# Patient Record
Sex: Male | Born: 1950 | Race: White | Hispanic: No | Marital: Married | Smoking: Former smoker
Health system: Southern US, Community
[De-identification: ages and names within clinical notes are randomized; demographics above are authoritative.]

## PROBLEM LIST (undated history)

## (undated) DIAGNOSIS — N529 Male erectile dysfunction, unspecified: Secondary | ICD-10-CM

## (undated) DIAGNOSIS — E785 Hyperlipidemia, unspecified: Secondary | ICD-10-CM

## (undated) DIAGNOSIS — N281 Cyst of kidney, acquired: Secondary | ICD-10-CM

## (undated) DIAGNOSIS — R972 Elevated prostate specific antigen [PSA]: Secondary | ICD-10-CM

## (undated) DIAGNOSIS — M1990 Unspecified osteoarthritis, unspecified site: Secondary | ICD-10-CM

## (undated) DIAGNOSIS — Z973 Presence of spectacles and contact lenses: Secondary | ICD-10-CM

## (undated) DIAGNOSIS — N403 Nodular prostate with lower urinary tract symptoms: Secondary | ICD-10-CM

## (undated) DIAGNOSIS — N433 Hydrocele, unspecified: Secondary | ICD-10-CM

## (undated) HISTORY — PX: INGUINAL HERNIA REPAIR: SUR1180

## (undated) HISTORY — PX: BACK SURGERY: SHX140

---
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
## 2002-12-19 ENCOUNTER — Ambulatory Visit (HOSPITAL_COMMUNITY): Admission: RE | Admit: 2002-12-19 | Discharge: 2002-12-19 | Payer: Self-pay | Admitting: Family Medicine

## 2004-08-18 ENCOUNTER — Encounter: Admission: RE | Admit: 2004-08-18 | Discharge: 2004-08-18 | Payer: Self-pay | Admitting: General Surgery

## 2004-08-19 ENCOUNTER — Ambulatory Visit (HOSPITAL_BASED_OUTPATIENT_CLINIC_OR_DEPARTMENT_OTHER): Admission: RE | Admit: 2004-08-19 | Discharge: 2004-08-19 | Payer: Self-pay | Admitting: General Surgery

## 2004-08-19 ENCOUNTER — Ambulatory Visit (HOSPITAL_COMMUNITY): Admission: RE | Admit: 2004-08-19 | Discharge: 2004-08-19 | Payer: Self-pay | Admitting: General Surgery

## 2011-04-06 ENCOUNTER — Other Ambulatory Visit: Payer: Self-pay

## 2011-04-06 ENCOUNTER — Emergency Department (HOSPITAL_COMMUNITY)
Admission: EM | Admit: 2011-04-06 | Discharge: 2011-04-06 | Disposition: A | Discharge status: Home / Self Care | Payer: 59 | Attending: Emergency Medicine | Admitting: Emergency Medicine

## 2011-04-06 ENCOUNTER — Encounter (HOSPITAL_COMMUNITY): Payer: Self-pay | Admitting: *Deleted

## 2011-04-06 DIAGNOSIS — I10 Essential (primary) hypertension: Secondary | ICD-10-CM | POA: Insufficient documentation

## 2011-04-06 DIAGNOSIS — R42 Dizziness and giddiness: Secondary | ICD-10-CM | POA: Insufficient documentation

## 2011-04-06 DIAGNOSIS — Z87891 Personal history of nicotine dependence: Secondary | ICD-10-CM | POA: Insufficient documentation

## 2011-04-06 DIAGNOSIS — Z7982 Long term (current) use of aspirin: Secondary | ICD-10-CM | POA: Insufficient documentation

## 2011-04-06 DIAGNOSIS — H81399 Other peripheral vertigo, unspecified ear: Secondary | ICD-10-CM

## 2011-04-06 LAB — CBC
HCT: 39.1 % (ref 39.0–52.0)
Hemoglobin: 13.2 g/dL (ref 13.0–17.0)
MCH: 26.3 pg (ref 26.0–34.0)
MCHC: 33.8 g/dL (ref 30.0–36.0)
MCV: 77.9 fL — ABNORMAL LOW (ref 78.0–100.0)
Platelets: 182 10*3/uL (ref 150–400)
RBC: 5.02 MIL/uL (ref 4.22–5.81)
RDW: 13.6 % (ref 11.5–15.5)
WBC: 7.5 10*3/uL (ref 4.0–10.5)

## 2011-04-06 LAB — GLUCOSE, CAPILLARY: Glucose-Capillary: 79 mg/dL (ref 70–99)

## 2011-04-06 LAB — DIFFERENTIAL
Basophils Absolute: 0 10*3/uL (ref 0.0–0.1)
Basophils Relative: 0 % (ref 0–1)
Eosinophils Absolute: 0.1 10*3/uL (ref 0.0–0.7)
Eosinophils Relative: 2 % (ref 0–5)
Lymphocytes Relative: 21 % (ref 12–46)
Lymphs Abs: 1.6 10*3/uL (ref 0.7–4.0)
Monocytes Absolute: 0.7 10*3/uL (ref 0.1–1.0)
Monocytes Relative: 9 % (ref 3–12)
Neutro Abs: 5.1 10*3/uL (ref 1.7–7.7)
Neutrophils Relative %: 68 % (ref 43–77)

## 2011-04-06 LAB — URINALYSIS, ROUTINE W REFLEX MICROSCOPIC
Bilirubin Urine: NEGATIVE
Glucose, UA: NEGATIVE mg/dL
Hgb urine dipstick: NEGATIVE
Ketones, ur: NEGATIVE mg/dL
Nitrite: NEGATIVE
Protein, ur: NEGATIVE mg/dL
Specific Gravity, Urine: 1.016 (ref 1.005–1.030)
Urobilinogen, UA: 0.2 mg/dL (ref 0.0–1.0)
pH: 6 (ref 5.0–8.0)

## 2011-04-06 LAB — POCT I-STAT, CHEM 8
BUN: 16 mg/dL (ref 6–23)
Calcium, Ion: 1.22 mmol/L (ref 1.12–1.32)
Chloride: 107 mEq/L (ref 96–112)
Creatinine, Ser: 1.2 mg/dL (ref 0.50–1.35)
Glucose, Bld: 96 mg/dL (ref 70–99)
HCT: 41 % (ref 39.0–52.0)
Hemoglobin: 13.9 g/dL (ref 13.0–17.0)
Potassium: 4.5 mEq/L (ref 3.5–5.1)
Sodium: 143 mEq/L (ref 135–145)
TCO2: 24 mmol/L (ref 0–100)

## 2011-04-06 LAB — URINE MICROSCOPIC-ADD ON

## 2011-04-06 MED ORDER — MECLIZINE HCL 50 MG PO TABS: 25.0000 mg | ORAL_TABLET | Freq: Three times a day (TID) | ORAL | Status: AC | PRN

## 2011-04-06 MED ORDER — MECLIZINE HCL 25 MG PO TABS
25.0000 mg | ORAL_TABLET | Freq: Once | ORAL | Status: AC
  Administered 2011-04-06: 25 mg via ORAL
  Filled 2011-04-06: qty 1

## 2011-04-06 MED ORDER — SODIUM CHLORIDE 0.9 % IV BOLUS (SEPSIS)
1000.0000 mL | Freq: Once | INTRAVENOUS | Status: AC
  Administered 2011-04-06: 1000 mL via INTRAVENOUS

## 2011-04-06 NOTE — ED Notes (Signed)
(  Denies: questions, needs, sx or concerns unmet, denies questions), denies pain or dizziness at this time, steady gait, out with family x2, declined w/c, given Rx x1 and referral, VSS, "feels better, ready to go".

## 2011-04-06 NOTE — Discharge Instructions (Signed)

## 2011-04-06 NOTE — ED Notes (Signed)
States he was sitting at a desk and using a calculator. When raised head he was 'woozie' states when walking into building he was 'very dizzy'. No nausea, no vomiting. Last meal at noon today. Skin w/d, resp e/u. States when turning his head he doesn't feel normal.

## 2011-04-06 NOTE — ED Notes (Signed)
Pt ambulated around [facility]. Pt experienced several episodes of dizziness. When it occurs, pt drifts to the left.

## 2011-04-06 NOTE — ED Provider Notes (Signed)
History     CSN: [id number]  Arrival date & time 04/06/11  1521   First MD Initiated Contact with Patient 04/06/11 1629      Chief Complaint  Patient presents with  . Dizziness    (Consider location/radiation/quality/duration/timing/severity/associated sxs/prior treatment) HPI  61 year old male with history of hypertension presents with a chief complaint of dizziness. She states he was sitting at work today working on [employer], when he remembers standing up and felt very dizzy. He described dizziness as a "funny sensation. He proceeds to walk and felt very unsteady. He denies falling, or loss of consciousness. Due to the symptoms he requests to come to the ED for further evaluation. He denies sensation of room spinning around. Patient also denies headache, fever, double vision, nausea, vomiting, diarrhea, chest pain, shortness of breath, abdominal pain, dysuria, weakness, numbness. Since sickness. He denies any indication changes. Has been taken off this medication. Patient denies any prior history of stroke. This is states he feels fine when laying still, his symptoms only worsened with head movement.  Past Medical History  Diagnosis Date  . Hypertension     History reviewed. No pertinent past surgical history.  History reviewed. No pertinent family history.  History  Substance Use Topics  . Smoking status: Former Games developer  . Smokeless tobacco: Not on file  . Alcohol Use: No      Review of Systems  All other systems reviewed and are negative.    Allergies  Review of patient's allergies indicates no known allergies.  Home Medications   Current Outpatient Rx  Name Route Sig Dispense Refill  . ACETAMINOPHEN 325 MG PO TABS Oral Take 650 mg by mouth every 6 (six) hours as needed. For pain    . ASPIRIN EC 81 MG PO TBEC Oral Take 81 mg by mouth daily.    . CYCLOBENZAPRINE HCL 10 MG PO TABS Oral Take 10 mg by mouth 3 (three) times daily.    . IBUPROFEN 200 MG PO TABS  Oral Take 400 mg by mouth every 6 (six) hours as needed. For pain    . LISINOPRIL 20 MG PO TABS Oral Take 20 mg by mouth daily.    Marland Kitchen METOPROLOL SUCCINATE ER 50 MG PO TB24 Oral Take 50 mg by mouth daily. Take with or immediately following a meal.    . ADULT MULTIVITAMIN W/MINERALS CH Oral Take 1 tablet by mouth daily.    Marland Kitchen SIMVASTATIN 40 MG PO TABS Oral Take 40 mg by mouth every evening.      BP 160/96  Pulse 80  Temp(Src) 98.8 F (37.1 C) (Oral)  Resp 13  SpO2 96%  Physical Exam  Nursing note and vitals reviewed. Constitutional: He is oriented to person, place, and time. He appears well-developed and well-nourished. No distress.       Awake, alert, nontoxic appearance  HENT:  Head: Atraumatic.  Right Ear: External ear normal.  Left Ear: External ear normal.  Mouth/Throat: Oropharynx is clear and moist.  Eyes: Conjunctivae are normal. Pupils are equal, round, and reactive to light. Right eye exhibits no discharge. Left eye exhibits no discharge. Right eye exhibits nystagmus. Right eye exhibits normal extraocular motion. Left eye exhibits nystagmus.       Fatigable nystagmus bilaterally.  Neck: Normal range of motion. Neck supple. No Brudzinski's sign and no Kernig's sign noted.  Cardiovascular: Normal rate and regular rhythm.   Pulmonary/Chest: Effort normal. No respiratory distress. He exhibits no tenderness.  Abdominal: Soft. There is no  tenderness. There is no rebound.  Musculoskeletal: He exhibits no tenderness.       ROM appears intact, no obvious focal weakness  Neurological: He is alert and oriented to person, place, and time. He has normal strength and normal reflexes. No cranial nerve deficit or sensory deficit. He exhibits normal muscle tone. He displays a negative Romberg sign. Coordination and gait normal. GCS eye subscore is 4. GCS verbal subscore is 5. GCS motor subscore is 6.  Reflex Scores:      Patellar reflexes are 2+ on the right side and 2+ on the left side. Skin:  Skin is warm and dry. No rash noted.  Psychiatric: He has a normal mood and affect.    ED Course  Procedures (including critical care time)   Labs Reviewed  GLUCOSE, CAPILLARY   No results found.   No diagnosis found.  Results for orders placed during the hospital encounter of 04/06/11  GLUCOSE, CAPILLARY      Component Value Range   Glucose-Capillary 79  70 - 99 (mg/dL)  URINALYSIS, ROUTINE W REFLEX MICROSCOPIC      Component Value Range   Color, Urine YELLOW  YELLOW    APPearance CLEAR  CLEAR    Specific Gravity, Urine 1.016  1.005 - 1.030    pH 6.0  5.0 - 8.0    Glucose, UA NEGATIVE  NEGATIVE (mg/dL)   Hgb urine dipstick NEGATIVE  NEGATIVE    Bilirubin Urine NEGATIVE  NEGATIVE    Ketones, ur NEGATIVE  NEGATIVE (mg/dL)   Protein, ur NEGATIVE  NEGATIVE (mg/dL)   Urobilinogen, UA 0.2  0.0 - 1.0 (mg/dL)   Nitrite NEGATIVE  NEGATIVE    Leukocytes, UA SMALL (*) NEGATIVE   CBC      Component Value Range   WBC 7.5  4.0 - 10.5 (K/uL)   RBC 5.02  4.22 - 5.81 (MIL/uL)   Hemoglobin 13.2  13.0 - 17.0 (g/dL)   HCT 16.1  09.6 - 04.5 (%)   MCV 77.9 (*) 78.0 - 100.0 (fL)   MCH 26.3  26.0 - 34.0 (pg)   MCHC 33.8  30.0 - 36.0 (g/dL)   RDW 40.9  81.1 - 91.4 (%)   Platelets 182  150 - 400 (K/uL)  DIFFERENTIAL      Component Value Range   Neutrophils Relative 68  43 - 77 (%)   Neutro Abs 5.1  1.7 - 7.7 (K/uL)   Lymphocytes Relative 21  12 - 46 (%)   Lymphs Abs 1.6  0.7 - 4.0 (K/uL)   Monocytes Relative 9  3 - 12 (%)   Monocytes Absolute 0.7  0.1 - 1.0 (K/uL)   Eosinophils Relative 2  0 - 5 (%)   Eosinophils Absolute 0.1  0.0 - 0.7 (K/uL)   Basophils Relative 0  0 - 1 (%)   Basophils Absolute 0.0  0.0 - 0.1 (K/uL)  URINE MICROSCOPIC-ADD ON      Component Value Range   Squamous Epithelial / LPF RARE  RARE    WBC, UA 3-6  <3 (WBC/hpf)   RBC / HPF 0-2  <3 (RBC/hpf)   Bacteria, UA RARE  RARE   POCT I-STAT, CHEM 8      Component Value Range   Sodium 143  135 - 145 (mEq/L)    Potassium 4.5  3.5 - 5.1 (mEq/L)   Chloride 107  96 - 112 (mEq/L)   BUN 16  6 - 23 (mg/dL)   Creatinine, Ser 7.82  0.50 -  1.35 (mg/dL)   Glucose, Bld 96  70 - 99 (mg/dL)   Calcium, Ion 1.19  1.47 - 1.32 (mmol/L)   TCO2 24  0 - 100 (mmol/L)   Hemoglobin 13.9  13.0 - 17.0 (g/dL)   HCT 82.9  56.2 - 13.0 (%)   No results found.    MDM  With symptoms suggestive of BPPV.  In no acute distress. He has no focal neuro deficit. He is Romberg negative, with normal gait. His dizziness is motion induce. Meclizine given for symptomatic treatment. Basic diagnostic labs order, with ecg, ua.  No head CT order as it has low yield for hemorrhagic stroke and pt denies headache.  I have low suspicion for central vertigo.    5:21 PM No significant finding on Dix-Hallpike maneuver, although pt sts he feels some dizziness sensation when head tilted to the right.  Discuss option of head CT scan with patient.  Discussed care with my attending.   6:11 PM Negative orthostatic VS.  was able to ambulate. No Significant dizziness.  Work up today is unremarkable.  Strict f/u instruction given.  Will discharge with meclizine.  Pt and family member agrees with plan.  My attending also agrees with plan.      Fayrene Helper, PA-C 04/06/11 1857

## 2011-04-06 NOTE — ED Notes (Signed)
PA at bedside.

## 2011-04-08 NOTE — ED Provider Notes (Signed)
60 y.o.malewho presents with dizziness  CV: RRR, no m/r/g, no pitting edema of the lower extremities Pulm:CTAB, no c/w/r GI: SNTND, + BS, no guarding or rebound Skin: no rashes noted MSK: moves all 4 extremities symmetrically, no deformities or injuries noted Neuro: CN 2-12 intact, no abnormalities of strength or sensation, A and O x 3  Discussed risks vs. Benefits of declining CT.  Patient presentation favors orthostasis and possible component of BPPV. ABCD2 < 3. Patient prefers no CT today since symptoms resolved.  Medical screening examination/treatment/procedure(s) were conducted as a shared visit with non-physician practitioner(s) and myself.  I personally evaluated the patient during the encounter        Cyndra Numbers, MD 04/08/11 [phone]

## 2015-02-05 MED FILL — POLYETHYLENE GLYCOL 3350 PO: 90 days supply | Qty: 1581 | Fill #3

## 2015-02-08 MED FILL — CIALIS 5 MG TABLET: 5 | 30 days supply | Qty: 30 | Fill #1

## 2015-02-14 MED FILL — DICLOFENAC SOD EC 50 MG TAB: 50 | 30 days supply | Qty: 60 | Fill #0

## 2015-02-22 DIAGNOSIS — M545 Low back pain: Secondary | ICD-10-CM | POA: Diagnosis not present

## 2015-02-22 DIAGNOSIS — K5909 Other constipation: Secondary | ICD-10-CM | POA: Diagnosis not present

## 2015-02-22 DIAGNOSIS — I10 Essential (primary) hypertension: Secondary | ICD-10-CM | POA: Diagnosis not present

## 2015-02-22 DIAGNOSIS — G8929 Other chronic pain: Secondary | ICD-10-CM | POA: Diagnosis not present

## 2015-02-22 DIAGNOSIS — Z1211 Encounter for screening for malignant neoplasm of colon: Secondary | ICD-10-CM | POA: Diagnosis not present

## 2015-02-22 DIAGNOSIS — E784 Other hyperlipidemia: Secondary | ICD-10-CM | POA: Diagnosis not present

## 2015-02-22 DIAGNOSIS — N529 Male erectile dysfunction, unspecified: Secondary | ICD-10-CM | POA: Diagnosis not present

## 2015-03-21 MED FILL — METOPROLOL SUCC ER 50 MG TA: 50 | 90 days supply | Qty: 90 | Fill #0

## 2015-03-21 MED FILL — CIALIS 5 MG TABLET: 5 | 30 days supply | Qty: 30 | Fill #2

## 2015-03-21 MED FILL — LISINOPRIL-HCTZ 20-12.5 MG: 20-12.5 | 90 days supply | Qty: 90 | Fill #0

## 2015-03-21 MED FILL — DICLOFENAC SOD EC 50 MG TAB: 50 | 30 days supply | Qty: 60 | Fill #1

## 2015-03-22 MED FILL — ROSUVASTATIN CALCIUM 20 MG: 20 | 90 days supply | Qty: 90 | Fill #0

## 2015-03-26 MED FILL — tiZANidine HCL 4 MG TABS: 4 | 30 days supply | Qty: 120 | Fill #0

## 2015-04-19 MED FILL — TAMSULOSIN HCL 0.4 MG CAP: 0.4 | 90 days supply | Qty: 180 | Fill #0

## 2015-04-19 MED FILL — DICLOFENAC SOD EC 50 MG TAB: 50 | 30 days supply | Qty: 60 | Fill #2

## 2015-04-22 MED FILL — CIALIS 5 MG TABLET: 5 | 30 days supply | Qty: 30 | Fill #3

## 2015-05-03 DIAGNOSIS — R635 Abnormal weight gain: Secondary | ICD-10-CM | POA: Diagnosis not present

## 2015-05-03 DIAGNOSIS — E291 Testicular hypofunction: Secondary | ICD-10-CM | POA: Diagnosis not present

## 2015-05-08 DIAGNOSIS — R7301 Impaired fasting glucose: Secondary | ICD-10-CM | POA: Diagnosis not present

## 2015-05-08 DIAGNOSIS — E663 Overweight: Secondary | ICD-10-CM | POA: Diagnosis not present

## 2015-05-20 DIAGNOSIS — E663 Overweight: Secondary | ICD-10-CM | POA: Diagnosis not present

## 2015-05-20 DIAGNOSIS — R7301 Impaired fasting glucose: Secondary | ICD-10-CM | POA: Diagnosis not present

## 2015-05-20 DIAGNOSIS — E539 Vitamin B deficiency, unspecified: Secondary | ICD-10-CM | POA: Diagnosis not present

## 2015-05-21 MED FILL — DICLOFENAC SOD EC 50 MG TAB: 50 | 30 days supply | Qty: 60 | Fill #3

## 2015-05-24 MED FILL — CIALIS 5 MG TABLET: 5 | 30 days supply | Qty: 30 | Fill #4

## 2015-05-30 DIAGNOSIS — E539 Vitamin B deficiency, unspecified: Secondary | ICD-10-CM | POA: Diagnosis not present

## 2015-05-30 DIAGNOSIS — R7301 Impaired fasting glucose: Secondary | ICD-10-CM | POA: Diagnosis not present

## 2015-05-30 DIAGNOSIS — E663 Overweight: Secondary | ICD-10-CM | POA: Diagnosis not present

## 2015-06-04 DIAGNOSIS — E663 Overweight: Secondary | ICD-10-CM | POA: Diagnosis not present

## 2015-06-04 DIAGNOSIS — R7301 Impaired fasting glucose: Secondary | ICD-10-CM | POA: Diagnosis not present

## 2015-06-04 DIAGNOSIS — E539 Vitamin B deficiency, unspecified: Secondary | ICD-10-CM | POA: Diagnosis not present

## 2015-06-05 MED FILL — tiZANidine HCL 4 MG TABS: 4 | 30 days supply | Qty: 120 | Fill #1

## 2015-06-11 DIAGNOSIS — E539 Vitamin B deficiency, unspecified: Secondary | ICD-10-CM | POA: Diagnosis not present

## 2015-06-11 DIAGNOSIS — E663 Overweight: Secondary | ICD-10-CM | POA: Diagnosis not present

## 2015-06-11 DIAGNOSIS — R7301 Impaired fasting glucose: Secondary | ICD-10-CM | POA: Diagnosis not present

## 2015-06-25 MED FILL — LISINOPRIL-HCTZ 20-12.5 MG: 20-12.5 | 90 days supply | Qty: 90 | Fill #1

## 2015-06-25 MED FILL — METOPROLOL SUCC ER 50 MG TA: 50 | 90 days supply | Qty: 90 | Fill #1

## 2015-06-25 MED FILL — CIALIS 5 MG TABLET: 5 | 30 days supply | Qty: 30 | Fill #5

## 2015-06-25 MED FILL — DICLOFENAC SOD EC 50 MG TAB: 50 | 30 days supply | Qty: 60 | Fill #4

## 2015-06-25 MED FILL — ROSUVASTATIN CALCIUM 20 MG: 20 | 90 days supply | Qty: 90 | Fill #1

## 2015-07-09 MED FILL — traMADol HCL 50 MG TABS: 50 | 30 days supply | Qty: 60 | Fill #0

## 2015-07-26 MED FILL — CIALIS 5 MG TABLET: 5 | 30 days supply | Qty: 30 | Fill #6

## 2015-07-26 MED FILL — DICLOFENAC SOD EC 50 MG TAB: 50 | 30 days supply | Qty: 60 | Fill #0

## 2015-07-26 MED FILL — TAMSULOSIN HCL 0.4 MG CAP: 0.4 | 90 days supply | Qty: 180 | Fill #1

## 2015-08-26 MED FILL — CIALIS 5 MG TABLET: 5 | 30 days supply | Qty: 30 | Fill #7

## 2015-09-11 ENCOUNTER — Encounter: Payer: Self-pay | Admitting: Family Medicine

## 2015-09-11 ENCOUNTER — Ambulatory Visit (INDEPENDENT_AMBULATORY_CARE_PROVIDER_SITE_OTHER): Payer: 59 | Admitting: Family Medicine

## 2015-09-11 DIAGNOSIS — R319 Hematuria, unspecified: Secondary | ICD-10-CM

## 2015-09-11 DIAGNOSIS — N400 Enlarged prostate without lower urinary tract symptoms: Secondary | ICD-10-CM | POA: Diagnosis not present

## 2015-09-11 DIAGNOSIS — M159 Polyosteoarthritis, unspecified: Secondary | ICD-10-CM | POA: Insufficient documentation

## 2015-09-11 DIAGNOSIS — N529 Male erectile dysfunction, unspecified: Secondary | ICD-10-CM | POA: Insufficient documentation

## 2015-09-11 DIAGNOSIS — E7800 Pure hypercholesterolemia, unspecified: Secondary | ICD-10-CM

## 2015-09-11 DIAGNOSIS — M21962 Unspecified acquired deformity of left lower leg: Secondary | ICD-10-CM

## 2015-09-11 DIAGNOSIS — M21961 Unspecified acquired deformity of right lower leg: Secondary | ICD-10-CM | POA: Diagnosis not present

## 2015-09-11 LAB — POCT URINALYSIS DIPSTICK
Bilirubin, UA: NEGATIVE
Blood, UA: NEGATIVE
Glucose, UA: NEGATIVE
Ketones, UA: NEGATIVE
Nitrite, UA: NEGATIVE
Protein, UA: NEGATIVE
Spec Grav, UA: 1.01
Urobilinogen, UA: 0.2
pH, UA: 5.5

## 2015-09-11 LAB — POCT UA - MICROSCOPIC ONLY

## 2015-09-11 MED ORDER — TIZANIDINE HCL 4 MG PO CAPS: 4.0000 mg | ORAL_CAPSULE | Freq: Three times a day (TID) | ORAL | 1 refills | Status: DC | PRN

## 2015-09-11 MED FILL — tiZANidine HCL 4 MG TABS: 4 | 20 days supply | Qty: 60 | Fill #0

## 2015-09-11 NOTE — Assessment & Plan Note (Signed)
Sounds consistent with a renal stone or transient urine discoloration .  Check ua for hematuria

## 2015-09-11 NOTE — Patient Instructions (Addendum)
Nice to meet you  I will send for records from your prior doctor  We will check a urinalysis and I will contact you if we need any further tests  I sent in a prescription for Taznidine  Try to monitor your blood pressure - it should be less than 140/90 most of the time   I would consider a colonoscopy  Please come back in 2 months when we can go over all your tests

## 2015-09-11 NOTE — Progress Notes (Signed)
Subjective  Patient is presenting for his first visit with the following illnesses  Joint pain Primarily back, knees and shoulders.  Wears sleeves on knees and back   Has 1-2 hours of stiffness in am and pain at end of day.  Moving makes it worse.  No joint redness or significant swelling.  No rashes.  Has tried various medications on his medication list but not much tylenol or NSAIDS.  Has seen a pain specialist in [place]   Foot Pain deformity Slowly worsening over the years.  Feet are crooked and hurt after being on them.  No skin breakdown or prior surgery   Red Urine About a month ago episode of red urine with some back pain.  No fever or trauma. Strong fhx of renal stones but has never had himself   Chief Complaint noted Review of Symptoms - see HPI PMH - Smoking status noted.     Objective Vital Signs reviewed Alert nad Ears:  External ear exam shows no significant lesions or deformities.  Otoscopic examination reveals clear canals, tympanic membranes are intact bilaterally without bulging, retraction, inflammation or discharge. Hearing is grossly normal bilaterall Eye - Pupils Equal Round Reactive to light, Extraocular movements intact, Fundi without hemorrhage or visible lesions, Conjunctiva without redness or discharge Neck:  No deformities, thyromegaly, masses, or tenderness noted.   Supple with full range of motion without pain. Heart - Regular rate and rhythm.  No murmurs, gallops or rubs.    Lungs:  Normal respiratory effort, chest expands symmetrically. Lungs are clear to auscultation, no crackles or wheezes. Abdomen: soft and non-tender without masses, organomegaly or hernias noted.  No guarding or rebound Extremities:  No cyanosis, edema, or deformity noted with good range of motion of all major joints.   Feet - Large toe deviation bilaterally with hammer toes and calluses.  No skin breakdown  Psych:  Cognition and judgment appear intact. Alert, communicative  and  cooperative with normal attention span and concentration. No apparent delusions, illusions, hallucinations Skin:  Intact without suspicious lesions or rashes     Assessments/Plans  No problem-specific Assessment & Plan notes found for this encounter.   See Encounter view if individual problem A/Ps not visible See after visit summary for details of patient instuctions

## 2015-09-11 NOTE — Assessment & Plan Note (Signed)
Causes him significant pain.  Taking reasonable medications.  No signs of inflamatory arthritis.  Will send for records

## 2015-09-11 NOTE — Assessment & Plan Note (Signed)
Moderately severe and likely to worsen.  Will refer to podiatry

## 2015-09-12 ENCOUNTER — Telehealth: Payer: Self-pay

## 2015-09-12 MED ORDER — DICLOFENAC SODIUM 50 MG PO TBEC: 50.0000 mg | DELAYED_RELEASE_TABLET | Freq: Two times a day (BID) | ORAL | 1 refills | Status: DC | PRN

## 2015-09-12 MED FILL — DICLOFENAC SOD EC 50 MG TAB: 50 | 90 days supply | Qty: 180 | Fill #0

## 2015-09-12 NOTE — Telephone Encounter (Signed)
Pt stopped by to state he left out a medication yesterday during his visit. Dicofenac DD EC 50mg  BID..... Pt would also like a 90day supply sent to his pharmacy. Please at advise.

## 2015-09-25 ENCOUNTER — Other Ambulatory Visit: Payer: Self-pay | Admitting: *Deleted

## 2015-09-25 MED ORDER — LISINOPRIL-HYDROCHLOROTHIAZIDE 20-12.5 MG PO TABS: 1.0000 | ORAL_TABLET | Freq: Every day | ORAL | 3 refills | Status: DC

## 2015-09-25 MED ORDER — TADALAFIL 5 MG PO TABS: 5.0000 mg | ORAL_TABLET | Freq: Every day | ORAL | 3 refills | Status: DC | PRN

## 2015-09-25 MED FILL — CIALIS 5 MG TABLET: 5 | 30 days supply | Qty: 30 | Fill #8

## 2015-09-25 MED FILL — LISINOPRIL-HCTZ 20-12.5 MG: 20-12.5 | 90 days supply | Qty: 90 | Fill #0

## 2015-09-25 NOTE — Telephone Encounter (Signed)
Patient called and needs refills on his medication. Durwin Davisson,CMA

## 2015-09-27 ENCOUNTER — Ambulatory Visit (INDEPENDENT_AMBULATORY_CARE_PROVIDER_SITE_OTHER): Payer: 59 | Admitting: Podiatry

## 2015-09-27 ENCOUNTER — Encounter: Payer: Self-pay | Admitting: Podiatry

## 2015-09-27 ENCOUNTER — Ambulatory Visit (INDEPENDENT_AMBULATORY_CARE_PROVIDER_SITE_OTHER): Payer: 59

## 2015-09-27 DIAGNOSIS — R52 Pain, unspecified: Secondary | ICD-10-CM

## 2015-09-27 DIAGNOSIS — M2040 Other hammer toe(s) (acquired), unspecified foot: Secondary | ICD-10-CM | POA: Diagnosis not present

## 2015-09-27 DIAGNOSIS — M2010 Hallux valgus (acquired), unspecified foot: Secondary | ICD-10-CM | POA: Diagnosis not present

## 2015-09-27 DIAGNOSIS — L84 Corns and callosities: Secondary | ICD-10-CM

## 2015-09-27 NOTE — Progress Notes (Signed)
   Subjective:    Patient ID: Nicholas Houston, male    DOB: 1950-08-07, 65 y.o.   MRN: [id number]  HPI  [age] year old male presents the office today for concerns of bunions and hammertoes and calluses to both the CT which is been ongoing for several months and the callouses gotten worse his been difficult to stand and walk due to the pain. Denies any drainage or redness or any swelling to his feet. He said no recent treatment for this. No other complaints.  Review of Systems  All other systems reviewed and are negative.      Objective:   Physical Exam General: AAO x3, NAD  Dermatological: Hyperkeratotic lesions bilateral submetatarsal 2 and third interspaces bilaterally. No underlying ulceration, drainage or other signs of infection.  Vascular: Dorsalis Pedis artery and Posterior Tibial artery pedal pulses are 2/4 bilateral with immedate capillary fill time. Pedal hair growth present.There is no pain with calf compression, swelling, warmth, erythema.   Neruologic: Grossly intact via light touch bilateral. Vibratory intact via tuning fork bilateral. Protective threshold with Semmes Wienstein monofilament intact to all pedal sites bilateral.   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 Tenderness all hyperkeratotic lesions. There is a decrease in medial arch upon weightbearing and some mild subjective tenderness on medial band of plantar fascial in the arch of the foot however he is next into the pain today. He states he gets pain in this area when working and standing a lot in the day. No other areas of tenderness bilaterally. MMT 5/5.  Assessment: Symptomatic hyperkeratotic lesions with bunion, hammertoe, flatfoot deformity  Plan: -Treatment options discussed including all alternatives, risks, and complications -Etiology of symptoms were discussed -X-rays were obtained and reviewed with the patient. No evidence of acute fracture or stress  fracture identified today bunion hammertoes are present. -Hyperkeratotic lesions debrided today 4 without complications or bleeding -Dispensed offloading pads -Discussed shoe gear modifications as well as orthotics. He'll start with power steps. These were dispensed today. -Follow up if symptoms continue or worsen. Call any questions concerns the meantime.   Ovid CurdMatthew Indiya Izquierdo, DPM

## 2015-09-27 NOTE — Patient Instructions (Signed)
Hammer Toes Hammer toes is a condition in which one or more of your toes is permanently flexed. CAUSES  This happens when a muscle imbalance or abnormal bone length makes your small toes buckle. This causes the toe joint to contract and the strong cord-like bands that attach muscles to the bones (tendons) in your toes to shorten.  SIGNS AND SYMPTOMS  Common symptoms of flexible hammer toes include:   A buildup of skin cells (corns). Corns occur where boney bumps come in frequent contact with hard surfaces. For example, where your shoes press and rub.  Irritation.  Inflammation.  Pain.  Limited motion in your toes. DIAGNOSIS  Hammer toes are diagnosed through a physical exam of your toes. During the exam, your health care provider may try to reproduce your symptoms by manipulating your foot. Often, X-ray exams are done to determine the degree of deformity and to make sure that the cause is not a fracture.  TREATMENT  Hammer toes can be treated with corrective surgery. There are several types of surgical procedures that can treat hammer toes. The most common procedures include:  Arthroplasty--A portion of the joint is surgically removed and your toe is straightened. The gap fills in with fibrous tissue. This procedure helps treat pain and deformity and helps restore function.  Fusion--Cartilage between the two bones of the affected joint is taken out and the bones fuse together into one longer bone. This helps keep your toe stable and reduces pain but leaves your toe stiff, yet straight.  Implantation--A portion of your bone is removed and replaced with an implant to restore motion.  Flexor tendon transfers--This procedure repositions the tendons that curl the toes down (flexor tendons). This may be done to release the deforming force that causes your toe to buckle. Several of these procedures require fixing your toe with a pin that is visible at the tip of your toe. The pin keeps the toe  straight during healing. Your health care provider will remove the pin usually within 4-8 weeks after the procedure.    This information is not intended to replace advice given to you by your health care provider. Make sure you discuss any questions you have with your health care provider.   Document Released: 01/10/2000 Document Revised: 01/17/2013 Document Reviewed: 09/19/2012 Elsevier Interactive Patient Education 2016 [employer]   Bunion (Hallux Valgus) A bony bump (protrusion) on the inside of the foot, at the base of the first toe, is called a bunion (hallux valgus). A bunion causes the first toe to angle toward the other toes. SYMPTOMS   A bony bump on the inside of the foot, causing an outward turning of the first toe. It may also overlap the second toe.  Thickening of the skin (callus) over the bony bump.  Fluid buildup under the callus. Fluid may become red, tender, and swollen (inflamed) with constant irritation or pressure.  Foot pain and stiffness. CAUSES  Many causes exist, including:  Inherited from your family (genetics).  Injury (trauma) forcing the first toe into a position in which it overlaps other toes.  Bunions are also associated with wearing shoes that have a narrow toe box (pointy shoes). RISK INCREASES WITH:  Family history of foot abnormalities, especially bunions.  Arthritis.  Narrow shoes, especially high heels. PREVENTION  Wear shoes with a wide toe box.  Avoid shoes with high heels.  Wear a small pad between the big toe and second toe.  Maintain proper conditioning:  Foot and ankle  flexibility.  Muscle strength and endurance. PROGNOSIS  With proper treatment, bunions can typically be cured. Occasionally, surgery is required.  RELATED COMPLICATIONS   Infection of the bunion.  Arthritis of the first toe.  Risks of surgery, including infection, bleeding, injury to nerves (numb toe), recurrent bunion, overcorrection (toe points  inward), arthritis of the big toe, big toe pointing upward, and bone not healing. TREATMENT  Treatment first consists of stopping the activities that aggravate the pain, taking pain medicines, and icing to reduce inflammation and pain. Wear shoes with a wide toe box. Shoes can be modified by a shoe repair person to relieve pressure on the bunion, especially if you cannot find shoes with a wide enough toe box. You may also place a pad with the center cut out in your shoe, to reduce pressure on the bunion. Sometimes, an arch support (orthotic) may reduce pressure on the bunion and alleviate the symptoms. Stretching and strengthening exercises for the muscles of the foot may be useful. You may choose to wear a brace or pad at night to hold the big toe away from the second toe. If non-surgical treatments are not successful, surgery may be needed. Surgery involves removing the overgrown tissue and correcting the position of the first toe, by realigning the bones. Bunion surgery is typically performed on an outpatient basis, meaning you can go home the same day as surgery. The surgery may involve cutting the mid portion of the bone of the first toe, or just cutting and repairing (reconstructing) the ligaments and soft tissues around the first toe.  MEDICATION   If pain medicine is needed, nonsteroidal anti-inflammatory medicines, such as aspirin and ibuprofen, or other minor pain relievers, such as acetaminophen, are often recommended.  Do not take pain medicine for 7 days before surgery.  Prescription pain relievers are usually only prescribed after surgery. Use only as directed and only as much as you need.  Ointments applied to the skin may be helpful. HEAT AND COLD  Cold treatment (icing) relieves pain and reduces inflammation. Cold treatment should be applied for 10 to 15 minutes every 2 to 3 hours for inflammation and pain and immediately after any activity that aggravates your symptoms. Use ice packs  or an ice massage.  Heat treatment may be used prior to performing the stretching and strengthening activities prescribed by your caregiver, physical therapist, or athletic trainer. Use a heat pack or a warm soak. SEEK MEDICAL CARE IF:   Symptoms get worse or do not improve in 2 weeks, despite treatment.  After surgery, you develop fever, increasing pain, redness, swelling, drainage of fluids, bleeding, or increasing warmth around the surgical area.  New, unexplained symptoms develop. (Drugs used in treatment may produce side effects.)   This information is not intended to replace advice given to you by your health care provider. Make sure you discuss any questions you have with your health care provider.   Document Released: 01/12/2005 Document Revised: 04/06/2011 Document Reviewed: 04/26/2008 Elsevier Interactive Patient Education [employer]

## 2015-10-02 ENCOUNTER — Ambulatory Visit: Payer: Self-pay | Admitting: Podiatry

## 2015-10-28 ENCOUNTER — Other Ambulatory Visit: Payer: Self-pay | Admitting: Family Medicine

## 2015-10-28 MED FILL — CIALIS 5 MG TABLET: 5 | 30 days supply | Qty: 30 | Fill #9

## 2015-10-28 NOTE — Telephone Encounter (Signed)
Pt needs a refill on tamsulosin. Please advise. Thanks! ep

## 2015-10-30 MED ORDER — TAMSULOSIN HCL 0.4 MG PO CAPS: 0.4000 mg | ORAL_CAPSULE | Freq: Two times a day (BID) | ORAL | 6 refills | Status: DC

## 2015-10-30 MED FILL — TAMSULOSIN HCL 0.4 MG CAP: 0.4 | 30 days supply | Qty: 60 | Fill #0

## 2015-11-27 MED FILL — CIALIS 5 MG TABLET: 5 | 30 days supply | Qty: 30 | Fill #10

## 2015-11-28 MED FILL — TAMSULOSIN HCL 0.4 MG CAP: 0.4 | 30 days supply | Qty: 60 | Fill #1

## 2015-12-04 ENCOUNTER — Encounter: Payer: Self-pay | Admitting: Family Medicine

## 2015-12-04 ENCOUNTER — Ambulatory Visit (INDEPENDENT_AMBULATORY_CARE_PROVIDER_SITE_OTHER): Payer: 59 | Admitting: Family Medicine

## 2015-12-04 DIAGNOSIS — Z23 Encounter for immunization: Secondary | ICD-10-CM

## 2015-12-04 DIAGNOSIS — R5383 Other fatigue: Secondary | ICD-10-CM

## 2015-12-04 DIAGNOSIS — Z1159 Encounter for screening for other viral diseases: Secondary | ICD-10-CM | POA: Diagnosis not present

## 2015-12-04 DIAGNOSIS — F329 Major depressive disorder, single episode, unspecified: Secondary | ICD-10-CM | POA: Insufficient documentation

## 2015-12-04 DIAGNOSIS — F32A Depression, unspecified: Secondary | ICD-10-CM | POA: Insufficient documentation

## 2015-12-04 LAB — COMPREHENSIVE METABOLIC PANEL
ALBUMIN: 4.2 g/dL (ref 3.6–5.1)
ALK PHOS: 35 U/L — AB (ref 40–115)
ALT: 22 U/L (ref 9–46)
AST: 24 U/L (ref 10–35)
BILIRUBIN TOTAL: 0.4 mg/dL (ref 0.2–1.2)
BUN: 16 mg/dL (ref 7–25)
CO2: 22 mmol/L (ref 20–31)
CREATININE: 1.39 mg/dL — AB (ref 0.70–1.25)
Calcium: 9.2 mg/dL (ref 8.6–10.3)
Chloride: 101 mmol/L (ref 98–110)
Glucose, Bld: 87 mg/dL (ref 65–99)
Potassium: 4.2 mmol/L (ref 3.5–5.3)
SODIUM: 136 mmol/L (ref 135–146)
TOTAL PROTEIN: 6.5 g/dL (ref 6.1–8.1)

## 2015-12-04 LAB — CBC
HEMATOCRIT: 40.5 % (ref 38.5–50.0)
HEMOGLOBIN: 13.5 g/dL (ref 13.2–17.1)
MCH: 26.7 pg — ABNORMAL LOW (ref 27.0–33.0)
MCHC: 33.3 g/dL (ref 32.0–36.0)
MCV: 80 fL (ref 80.0–100.0)
MPV: 10.1 fL (ref 7.5–12.5)
Platelets: 214 10*3/uL (ref 140–400)
RBC: 5.06 MIL/uL (ref 4.20–5.80)
RDW: 14.2 % (ref 11.0–15.0)
WBC: 4.4 10*3/uL (ref 3.8–10.8)

## 2015-12-04 LAB — TSH: TSH: 3.02 mIU/L (ref 0.40–4.50)

## 2015-12-04 MED ORDER — ZOSTER VACCINE LIVE 19400 UNT/0.65ML ~~LOC~~ SUSR: 0.6500 mL | Freq: Once | SUBCUTANEOUS | 0 refills | Status: AC

## 2015-12-04 NOTE — Assessment & Plan Note (Signed)
New presentation.  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  Will check labs to rule out other systemic diseases.  Discussed counseling and perhaps medication in future.  Note family history of what sounds to be bipolar do in siblings

## 2015-12-04 NOTE — Progress Notes (Signed)
Subjective  Patient is presenting with the following illnesses  Presenting Issue: feeling not as well as he should.   Report of symptoms: Feels he is not enjoying life as much as he should.  Used to like Engineer, waterGolf and Nascar and football games but not as much now.  Some excessive sleeping.    Duration of CURRENT symptoms:several years Age of onset of first mood disturbance:this is first time  Impact on function: Does not want to do things as much.  Does not go out as much.   Things with his wife and daughter are as good as they have ever been  Psychiatric History -No past history of psychiatric issues  Family history of psychiatric issues:Brother and sister with substance abuse and what sounds like bipolar - up and down.  Brother was committed in the distant past  Current and history of substance ZOX:WRUEuse:none  No history of weight loss that is unintentional.  No fever or heat cold intolerance.  Is eating better and exercising more than ever has   PHQ-9:12  Chief Complaint noted Review of Symptoms - see HPI PMH - Smoking status noted.     Objective Vital Signs reviewed Psych:  Cognition and judgment appear intact. Alert, communicative  and cooperative with normal attention span and concentration. No apparent delusions, illusions, hallucinations  Assessments/Plans  No problem-specific Assessment & Plan notes found for this encounter.   See Encounter view if individual problem A/Ps not visible See after visit summary for details of patient instuctions

## 2015-12-04 NOTE — Patient Instructions (Signed)
Good to see you today!  Thanks for coming in.  I will call you if your lab tests are not normal.  Otherwise we will discuss them at your next visit.  Come back in 2-3 weeks  Note what things are enjoyable and what things are not  Call if your symptoms are gettting worse

## 2015-12-04 NOTE — Progress Notes (Signed)
Subjective  Patient is presenting with the following illnesses     Chief Complaint noted Review of Symptoms - see HPI PMH - Smoking status noted.     Objective Vital Signs reviewed     Assessments/Plans  No problem-specific Assessment & Plan notes found for this encounter.   See Encounter view if individual problem A/Ps not visible See after visit summary for details of patient instuctions 

## 2015-12-05 LAB — HEPATITIS C ANTIBODY: HCV Ab: NEGATIVE

## 2015-12-12 MED FILL — DICLOFENAC SOD EC 50 MG TAB: 50 | 90 days supply | Qty: 180 | Fill #1

## 2015-12-27 MED FILL — TAMSULOSIN HCL 0.4 MG CAP: 0.4 | 30 days supply | Qty: 60 | Fill #2

## 2015-12-27 MED FILL — CIALIS 5 MG TABLET: 5 | 30 days supply | Qty: 30 | Fill #11

## 2016-01-01 ENCOUNTER — Encounter: Payer: Self-pay | Admitting: Family Medicine

## 2016-01-01 ENCOUNTER — Ambulatory Visit (INDEPENDENT_AMBULATORY_CARE_PROVIDER_SITE_OTHER): Payer: 59 | Admitting: Family Medicine

## 2016-01-01 DIAGNOSIS — F321 Major depressive disorder, single episode, moderate: Secondary | ICD-10-CM | POA: Diagnosis not present

## 2016-01-01 NOTE — Patient Instructions (Signed)
Good to see you today!  Thanks for coming in.  Employee Assistance [phone] Call to set up a time to meet  If you have any problems getting an appointment please call me  If you are feeling worse or have new symptoms call or contact me through My Chart  Come back in February to see how things are going

## 2016-01-01 NOTE — Assessment & Plan Note (Signed)
Seems consistent with this dx after exam and lab work up.  Discussed possible options.  He is interested in counseling and will follow up with EAP.  See after visit summary

## 2016-01-01 NOTE — Progress Notes (Signed)
Subjective  Patient is presenting with the following illnesses  Depression  Is feeling about the same.  Still not as much interest in doing things as in the past.  No suicidal ideation.   PHQ9= 13 (trouble concentrating, sleep and energy) (last 12) No weight loss or fevers   Chief Complaint noted Review of Symptoms - see HPI PMH - Smoking status noted.     Objective Vital Signs reviewed Psych:  Cognition and judgment appear intact. Alert, communicative  and cooperative with normal attention span and concentration. No apparent delusions, illusions, hallucinations     Assessments/Plans  No problem-specific Assessment & Plan notes found for this encounter.   See Encounter view if individual problem A/Ps not visible See after visit summary for details of patient instuctions

## 2016-01-28 MED FILL — TAMSULOSIN HCL 0.4 MG CAP: 0.4 | 30 days supply | Qty: 60 | Fill #3

## 2016-01-29 ENCOUNTER — Other Ambulatory Visit: Payer: Self-pay | Admitting: *Deleted

## 2016-01-29 MED ORDER — TADALAFIL 5 MG PO TABS: 5.0000 mg | ORAL_TABLET | Freq: Every day | ORAL | 6 refills | Status: DC | PRN

## 2016-03-20 ENCOUNTER — Telehealth: Payer: Self-pay | Admitting: Family Medicine

## 2016-06-09 ENCOUNTER — Telehealth: Payer: Self-pay | Admitting: Family Medicine

## 2016-06-09 NOTE — Telephone Encounter (Signed)
Pt was unable to hear me on his home/mobile number and eventually hung up. Called the number under Christine P. and the phone didn't ring. Attempted to call home/mobile number a second time and pt didn't answer - couldn't leave a message. - Mesha Guinyard

## 2016-07-16 ENCOUNTER — Telehealth: Payer: Self-pay | Admitting: *Deleted

## 2016-07-16 DIAGNOSIS — F321 Major depressive disorder, single episode, moderate: Secondary | ICD-10-CM

## 2016-07-16 MED ORDER — SILDENAFIL CITRATE 100 MG PO TABS: 50.0000 mg | ORAL_TABLET | Freq: Every day | ORAL | 3 refills | Status: DC | PRN

## 2016-07-16 MED FILL — SILDENAFIL 100 MG TABLET: 100 | 30 days supply | Qty: 6 | Fill #0

## 2016-07-16 NOTE — Telephone Encounter (Signed)
New rx sent into pharmacy.

## 2016-07-16 NOTE — Assessment & Plan Note (Signed)
Spoke with patient when saw him in the hospital last week.  He is feeling well

## 2016-07-16 NOTE — Telephone Encounter (Signed)
Pharmacy sent a fax stating that brand name Cialis is not on the preferred list [facility] this year.  Preferred is sildenafil 20 mg, 25mg  50mg , or 100mg .  Please advise.  Jazmin Hartsell,CMA

## 2016-08-17 DIAGNOSIS — H524 Presbyopia: Secondary | ICD-10-CM | POA: Diagnosis not present

## 2017-04-22 ENCOUNTER — Telehealth: Payer: Self-pay | Admitting: Family Medicine

## 2017-04-22 NOTE — Telephone Encounter (Signed)
Pt's barrier to getting a colonoscopy is time and coordinating appts.  I spoke to him about the stool sample option and encouraged in to schedule an appt wit you to go over his options.

## 2017-05-05 ENCOUNTER — Ambulatory Visit (INDEPENDENT_AMBULATORY_CARE_PROVIDER_SITE_OTHER): Payer: 59 | Admitting: Family Medicine

## 2017-05-05 ENCOUNTER — Telehealth: Payer: Self-pay

## 2017-05-05 VITALS — BP 125/75 | HR 77 | Temp 98.0°F | Wt 175.8 lb

## 2017-05-05 DIAGNOSIS — Z0001 Encounter for general adult medical examination with abnormal findings: Secondary | ICD-10-CM | POA: Diagnosis not present

## 2017-05-05 DIAGNOSIS — R319 Hematuria, unspecified: Secondary | ICD-10-CM

## 2017-05-05 DIAGNOSIS — Z8679 Personal history of other diseases of the circulatory system: Secondary | ICD-10-CM

## 2017-05-05 DIAGNOSIS — E7800 Pure hypercholesterolemia, unspecified: Secondary | ICD-10-CM | POA: Diagnosis not present

## 2017-05-05 DIAGNOSIS — Z1211 Encounter for screening for malignant neoplasm of colon: Secondary | ICD-10-CM | POA: Diagnosis not present

## 2017-05-05 DIAGNOSIS — Z125 Encounter for screening for malignant neoplasm of prostate: Secondary | ICD-10-CM | POA: Diagnosis not present

## 2017-05-05 DIAGNOSIS — R1909 Other intra-abdominal and pelvic swelling, mass and lump: Secondary | ICD-10-CM | POA: Diagnosis not present

## 2017-05-05 DIAGNOSIS — I10 Essential (primary) hypertension: Secondary | ICD-10-CM | POA: Insufficient documentation

## 2017-05-05 DIAGNOSIS — N433 Hydrocele, unspecified: Secondary | ICD-10-CM | POA: Insufficient documentation

## 2017-05-05 DIAGNOSIS — F321 Major depressive disorder, single episode, moderate: Secondary | ICD-10-CM

## 2017-05-05 NOTE — Telephone Encounter (Signed)
Called patient and informed him of his ultrasound at [facility] on Tuesday 05/11/2017-1130 with a show time of 1115. Patient has no problem with appointment date and time.Glennie HawkSimpson, Akyia Borelli R, CMA

## 2017-05-05 NOTE — Assessment & Plan Note (Signed)
Repeat ua was normal

## 2017-05-05 NOTE — Assessment & Plan Note (Signed)
Based on exam unable to tell if he has a testicular mass and a hernia or if the mass is all hernia.  Check US.  May need urology and or surgery referral

## 2017-05-05 NOTE — Assessment & Plan Note (Signed)
Improved feels he is doing well

## 2017-05-05 NOTE — Patient Instructions (Signed)
Good to see you today!  Thanks for coming in.  We will set up an ultrasound of your groin.  If you do not hear about it in two week call us  Send in your stool cards  I will call you if your tests are not good.  Otherwise I will send you a letter.  If you do not hear from me with in 2 weeks please call our office.

## 2017-05-05 NOTE — Progress Notes (Signed)
Subjective  Patient is presenting with the following illnesses   GROIN MASS Has had swelling in his scrotum and above for many years perhaps since his last hernia operation a decade ago.  Seems to get bigger and smaller.  Not painful but aches sometimes.  No fever or nausea and vomiting or bowel problems.  He is unsure if he was told the swelling was due to a hernia or involved his testicle   HO HYPERTENSION Hs been off medications for a year.   No lightheadedness or chest pain or shortness of breath  CHOLESTEROL Has been off a statin for more than a year.  Lost weight and changed his diet and did not think he needed anymore.    Patient reports no  vision/ hearing changes,anorexia, weight change, fever ,adenopathy, persistent / recurrent hoarseness, swallowing issues, chest pain, edema,persistent / recurrent cough, hemoptysis, dyspnea(rest, exertional, paroxysmal nocturnal), gastrointestinal  bleeding (melena, rectal bleeding), abdominal pain, excessive heart burn, GU symptoms(dysuria, hematuria, pyuria, voiding/incontinence  Issues) syncope, focal weakness, severe memory loss, concerning skin lesions, depression, anxiety, abnormal bruising/bleeding, major joint swelling.      Chief Complaint noted Review of Symptoms - see HPI PMH - Smoking status noted.     Objective Vital Signs reviewed Neck:  No deformities, thyromegaly, masses, or tenderness noted.   Supple with full range of motion without pain. Heart - Regular rate and rhythm.  No murmurs, gallops or rubs.    Lungs:  Normal respiratory effort, chest expands symmetrically. Lungs are clear to auscultation, no crackles or wheezes. Skin:  Intact without suspicious lesions or rashes Abdomen: soft and non-tender without masses, organomegaly or hernias noted.  No guarding or rebound Groin - Large > 8 cm solid mass in Left scrotum can't differentiate from testicle Seems contiguous with a solid but softer swelling in the lower inguinal  area which is consistent with a hernia.  non tender non reducible     Assessments/Plans  History of hypertension Blood pressure is controlled now off al medications  - check labs  Pure hypercholesterolemia Not on medications now.  Check labs   Left groin mass Based on exam unable to tell if he has a testicular mass and a hernia or if the mass is all hernia.  Check US.  May need urology and or surgery referral   Depression Improved feels he is doing well   Hematuria of undiagnosed cause Repeat ua was normal    See after visit summary for details of patient instuctions

## 2017-05-05 NOTE — Assessment & Plan Note (Signed)
Blood pressure is controlled now off al medications  - check labs

## 2017-05-05 NOTE — Assessment & Plan Note (Signed)
Not on medications now.  Check labs

## 2017-05-06 ENCOUNTER — Encounter: Payer: Self-pay | Admitting: Family Medicine

## 2017-05-06 LAB — CMP14+EGFR
A/G RATIO: 1.9 (ref 1.2–2.2)
ALT: 11 IU/L (ref 0–44)
AST: 19 IU/L (ref 0–40)
Albumin: 4.3 g/dL (ref 3.6–4.8)
Alkaline Phosphatase: 50 IU/L (ref 39–117)
BILIRUBIN TOTAL: 0.3 mg/dL (ref 0.0–1.2)
BUN/Creatinine Ratio: 10 (ref 10–24)
BUN: 12 mg/dL (ref 8–27)
CO2: 24 mmol/L (ref 20–29)
Calcium: 9 mg/dL (ref 8.6–10.2)
Chloride: 102 mmol/L (ref 96–106)
Creatinine, Ser: 1.15 mg/dL (ref 0.76–1.27)
GFR calc non Af Amer: 65 mL/min/{1.73_m2} (ref >59)
GFR, EST AFRICAN AMERICAN: 76 mL/min/{1.73_m2} (ref >59)
Globulin, Total: 2.3 g/dL (ref 1.5–4.5)
Glucose: 93 mg/dL (ref 65–99)
POTASSIUM: 4 mmol/L (ref 3.5–5.2)
SODIUM: 140 mmol/L (ref 134–144)
TOTAL PROTEIN: 6.6 g/dL (ref 6.0–8.5)

## 2017-05-06 LAB — LIPID PANEL
CHOL/HDL RATIO: 4.3 ratio (ref 0.0–5.0)
Cholesterol, Total: 234 mg/dL — ABNORMAL HIGH (ref 100–199)
HDL: 54 mg/dL (ref >39)
LDL CALC: 153 mg/dL — AB (ref 0–99)
TRIGLYCERIDES: 134 mg/dL (ref 0–149)
VLDL Cholesterol Cal: 27 mg/dL (ref 5–40)

## 2017-05-06 LAB — PSA: Prostate Specific Ag, Serum: 1.8 ng/mL (ref 0.0–4.0)

## 2017-05-07 MED ORDER — ROSUVASTATIN CALCIUM 10 MG PO TABS: 10.0000 mg | ORAL_TABLET | Freq: Every day | ORAL | 2 refills | Status: DC

## 2017-05-07 MED FILL — ROSUVASTATIN CALCIUM 10 MG: 10 | 90 days supply | Qty: 90 | Fill #0

## 2017-05-11 ENCOUNTER — Ambulatory Visit (HOSPITAL_COMMUNITY)
Admission: RE | Admit: 2017-05-11 | Discharge: 2017-05-11 | Disposition: A | Discharge status: Home / Self Care | Payer: 59 | Source: Ambulatory Visit | Attending: Family Medicine | Admitting: Family Medicine

## 2017-05-11 DIAGNOSIS — N433 Hydrocele, unspecified: Secondary | ICD-10-CM | POA: Insufficient documentation

## 2017-05-11 DIAGNOSIS — R1909 Other intra-abdominal and pelvic swelling, mass and lump: Secondary | ICD-10-CM | POA: Insufficient documentation

## 2017-05-13 ENCOUNTER — Encounter: Payer: Self-pay | Admitting: Family Medicine

## 2017-05-13 NOTE — Assessment & Plan Note (Signed)
US appears to be hydrocele without hernia Will refer to urology

## 2017-05-13 NOTE — Addendum Note (Signed)
Addended by: Pearlean BrownieHAMBLISS, Jaydalyn Demattia L on: 05/13/2017 02:07 PM   Modules accepted: Orders

## 2017-05-18 ENCOUNTER — Encounter: Payer: Self-pay | Admitting: Family Medicine

## 2017-05-18 ENCOUNTER — Ambulatory Visit (INDEPENDENT_AMBULATORY_CARE_PROVIDER_SITE_OTHER): Payer: 59 | Admitting: Family Medicine

## 2017-05-18 DIAGNOSIS — N433 Hydrocele, unspecified: Secondary | ICD-10-CM

## 2017-05-18 NOTE — Progress Notes (Signed)
Subjective  Nicholas Houston is a 67 y.o. male is presenting with the following  HYDROCELES Feels about the same.  No real pain but heavy weight and bother with work.  No nausea and vomiting or fever  Chief Complaint noted Review of Symptoms - see HPI PMH - Smoking status noted.    Objective Vital Signs reviewed BP 125/65 (BP Location: Left Arm, Patient Position: Sitting, Cuff Size: Normal)   Pulse 69   Temp 97.8 F (36.6 C) (Oral)   Wt 174 lb 6.4 oz (79.1 kg)   SpO2 98%   BMI 24.32 kg/m   Assessments/Plans  See after visit summary for details of patient instuctions  Bilateral hydrocele Stable.  No signs of cancer on US.  Will refer to urology.  Answered his questions as best I could about procedure and recovery but suggested he discuss with urologist

## 2017-05-18 NOTE — Assessment & Plan Note (Signed)
Stable.  No signs of cancer on US.  Will refer to urology.  Answered his questions as best I could about procedure and recovery but suggested he discuss with urologist

## 2017-05-18 NOTE — Patient Instructions (Signed)
You should hear from Urology in the next 10 days if not call me or send me a message  I think fixing the hydroceles first then if have a hernia that bothers you we can refer you to surgery  If the lump becomes very tender or hard or red then go to the ER  Send in your stool cards  Come back in 1 year or sooner if needed

## 2017-05-24 ENCOUNTER — Other Ambulatory Visit: Payer: Self-pay

## 2017-05-24 DIAGNOSIS — Z1211 Encounter for screening for malignant neoplasm of colon: Secondary | ICD-10-CM

## 2017-05-27 LAB — FECAL OCCULT BLOOD, IMMUNOCHEMICAL: Fecal Occult Bld: NEGATIVE

## 2017-06-04 DIAGNOSIS — N403 Nodular prostate with lower urinary tract symptoms: Secondary | ICD-10-CM | POA: Diagnosis not present

## 2017-06-04 DIAGNOSIS — N430 Encysted hydrocele: Secondary | ICD-10-CM | POA: Diagnosis not present

## 2017-06-04 DIAGNOSIS — R3915 Urgency of urination: Secondary | ICD-10-CM | POA: Diagnosis not present

## 2017-06-04 MED FILL — levoFLOXacin 750 MG TABS: 750 | 1 days supply | Qty: 1 | Fill #0

## 2017-07-22 DIAGNOSIS — R3915 Urgency of urination: Secondary | ICD-10-CM | POA: Diagnosis not present

## 2017-07-22 DIAGNOSIS — R3914 Feeling of incomplete bladder emptying: Secondary | ICD-10-CM | POA: Diagnosis not present

## 2017-07-22 DIAGNOSIS — N281 Cyst of kidney, acquired: Secondary | ICD-10-CM | POA: Diagnosis not present

## 2017-07-22 DIAGNOSIS — N403 Nodular prostate with lower urinary tract symptoms: Secondary | ICD-10-CM | POA: Diagnosis not present

## 2017-07-30 DIAGNOSIS — N430 Encysted hydrocele: Secondary | ICD-10-CM | POA: Diagnosis not present

## 2017-07-30 DIAGNOSIS — N403 Nodular prostate with lower urinary tract symptoms: Secondary | ICD-10-CM | POA: Diagnosis not present

## 2017-07-30 DIAGNOSIS — R3914 Feeling of incomplete bladder emptying: Secondary | ICD-10-CM | POA: Diagnosis not present

## 2017-07-30 DIAGNOSIS — R3915 Urgency of urination: Secondary | ICD-10-CM | POA: Diagnosis not present

## 2017-07-30 MED FILL — ALFUZOSIN HCL ER 10 MG TAB: 10 | 30 days supply | Qty: 30 | Fill #0

## 2017-08-06 ENCOUNTER — Other Ambulatory Visit: Payer: Self-pay | Admitting: Urology

## 2017-08-09 MED FILL — ROSUVASTATIN CALCIUM 10 MG: 10 | 90 days supply | Qty: 90 | Fill #1

## 2017-08-17 ENCOUNTER — Encounter (HOSPITAL_BASED_OUTPATIENT_CLINIC_OR_DEPARTMENT_OTHER): Payer: Self-pay | Admitting: *Deleted

## 2017-08-17 ENCOUNTER — Other Ambulatory Visit: Payer: Self-pay

## 2017-08-17 NOTE — Progress Notes (Signed)
Spoke w/ pt via phone for pre-op interview.  Npo after mn.  Arrive at [id number].

## 2017-08-18 NOTE — H&P (Signed)
CC/HPI: I have symptoms of an enlarged prostate.     Mr. Nicholas Houston returns today for voiding studies for LUTS following a prostate biopsy for a right base nodule with a PSA of 1.8. The biopsy was negative. He has moderate LUTS with an IPSS of 13 and failed to improve with tamsulosin in the past. His prostate volume was 44ml.     CC: I have swelling in my scrotum.  HPI: Nicholas Houston is a 67 year-old male established patient who is here for scrotal swelling.    He has a symptomatic left hydrocele with an inguinal component that has been present for several years.      ALLERGIES: None   MEDICATIONS: Aspirin Ec 325 mg tablet, delayed release  Rosuvastatin Calcium 10 mg tablet 1 tablet PO Daily     GU PSH: Complex Uroflow - 07/22/2017 Prostate Needle Biopsy - 07/22/2017    NON-GU PSH: Hernia Repair Surgical Pathology, Gross And Microscopic Examination For Prostate Needle - 07/22/2017    GU PMH: Incomplete bladder emptying, PVR was 294ml. He will need cystoscopy depending on the prostate biopsy findings. - 07/22/2017 Oth congen malformations Vas deferens, epididymis, seminal vesicles and prostate, Right, Absent right SV. - 07/22/2017 Prostate nodule w/ LUTS, FR is low with a prolonged voiding curve. - 07/22/2017, He has a small nodule at the right base with a normal PSA. I am going to have him return for a prostate US and biopsy and reviewed the risks of bleeding, infection and difficulty voiding. , - 06/04/2017 Hydrocele, Left, He has a large left hydrocele with an inguinal component and is symptomatic. He will need hydrocelectomy and I reviewed the risks but he needs the prostate nodule and LUTs evaluated first. - 06/04/2017 Urinary Urgency, He will return for a flowrate, PVR and possible cystoscopy to better clarify the nature of his voiding issues. - 06/04/2017    NON-GU PMH: Arthritis Hypercholesterolemia    FAMILY HISTORY: Heart problem - Father Kidney Stones - Father stroke -  Mother   SOCIAL HISTORY: Marital Status: Married Preferred Language: English; Race: White Current Smoking Status: Patient has never smoked.   Tobacco Use Assessment Completed: Used Tobacco in last 30 days? Drinks 2 caffeinated drinks per day. 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     Notes: 1 daughter    REVIEW OF SYSTEMS:    GU Review Male:   Patient denies frequent urination, hard to postpone urination, burning/ pain with urination, get up at night to urinate, leakage of urine, stream starts and stops, trouble starting your stream, have to strain to urinate , erection problems, and penile pain.  Gastrointestinal (Upper):   Patient denies nausea, vomiting, and indigestion/ heartburn.  Gastrointestinal (Lower):   Patient denies diarrhea and constipation.  Constitutional:   Patient denies fever, night sweats, weight loss, and fatigue.  Skin:   Patient denies skin rash/ lesion and itching.  Eyes:   Patient denies blurred vision and double vision.  Ears/ Nose/ Throat:   Patient denies sore throat and sinus problems.  Hematologic/Lymphatic:   Patient denies swollen glands and easy bruising.  Cardiovascular:   Patient denies leg swelling and chest pains.  Respiratory:   Patient denies cough and shortness of breath.  Endocrine:   Patient denies excessive thirst.  Musculoskeletal:   Patient denies back pain and joint pain.  Neurological:   Patient denies headaches and dizziness.  Psychologic:   Patient denies depression and anxiety.   VITAL SIGNS:      07/30/2017 02:53 PM  BP 165/85 mmHg  Pulse 60 /min  Temperature 97.1 F / 36.1 C   GU PHYSICAL EXAMINATION:    Scrotum: No lesions. No edema. No cysts. No warts.   Epididymides: Right: No spermatocele, no masses, no cysts, no tenderness, no induration, no enlargement. Left:Not palpable  Testes: Left testicle not palpable in the large left hydrocele with a scrotal and inguinal component. . No tenderness, no swelling, no enlargement  right testis. No variicocele, no hydrocele right testis.   Urethral Meatus: Normal size. No lesion, no wart, no discharge, no polyp. Normal location.  Penis: Circumcised, no warts, no cracks. No dorsal Peyronie's plaques, no left corporal Peyronie's plaques, no right corporal Peyronie's plaques, no scarring, no warts. No balanitis, no meatal stenosis.   MULTI-SYSTEM PHYSICAL EXAMINATION:    Constitutional: Well-nourished. No physical deformities. Normally developed. Good grooming.   Respiratory: No labored breathing, no use of accessory muscles. Normal breath sounds.   Cardiovascular: Regular rate and rhythm. No murmur, no gallop. Normal temperature, normal extremity pulses, no swelling, no varicosities.      PAST DATA REVIEWED:  Source Of History:  Patient  Records Review:   Pathology Reports  Urine Test Review:   Urinalysis  Urodynamics Review:   Review Bladder Scan   PROCEDURES:         Flexible Cystoscopy - 52000  Risks, benefits, and some of the potential complications of the procedure were discussed. 10ml of 2% lidocaine jelly was instilled intraurethrally.     Meatus:  Normal size. Normal location. Normal condition.  Urethra:  No strictures.  External Sphincter:  Normal.  Verumontanum:  Normal.  Prostate:  Obstructing. Moderate hyperplasia. 3-4cm bilobar  Bladder Neck:  Non-obstructing.  Ureteral Orifices:  Normal location. Normal size. Normal shape. Effluxed clear urine.  Bladder:  Mild trabeculation. No tumors. Normal mucosa. No stones.      The procedure was well tolerated and there were no complications.          Flow Rate - 51741  Average Flow Rate: 5 cc/sec  Voided Volume: 207 cc  Peak Flow Rate: 7 cc/sec  Time of Peak Flow: 0:10 min:sec  Flow Time: 0:35 min:sec  Total Void Time: 0:35 min:sec         Urinalysis w/Scope Dipstick Dipstick Cont'd Micro  Color: Yellow Bilirubin: Neg WBC/hpf: NS (Not Seen)  Appearance: Clear Ketones: Neg RBC/hpf: 3 - 10/hpf   Specific Gravity: 1.015 Blood: 3+ Bacteria: NS (Not Seen)  pH: 7.5 Protein: Neg Cystals: NS (Not Seen)  Glucose: Neg Urobilinogen: 0.2 Casts: NS (Not Seen)    Nitrites: Neg Trichomonas: Not Present    Leukocyte Esterase: Neg Mucous: Not Present      Epithelial Cells: 0 - 5/hpf      Yeast: NS (Not Seen)      Sperm: Not Present    ASSESSMENT:      ICD-10 Details  1 GU:   Hydrocele - N43.0 Left, He has a symptomatic left hydrocele and would like a hydrocelectomy. I have reviewed the risks of bleeding, infection, wound complications, recurrent hydrocele, testicular atrophy or loss, chronic pain, thrombotic events and anesthetic complications.   2   Prostate nodule w/ LUTS - N40.3 His prostate biopsy was negative.   3   Incomplete bladder emptying - R39.14 He has moderate LUTS with bilobar hyperplasia and incomplete emptying with reduces stream. I discussed trying additional meds, Urolift and TURP and will send in Alfuzosin.   4   Urinary Urgency - R39.15  PLAN:            Medications New Meds: Uroxatral 10 mg tablet, extended release 24 hr 1 tablet PO Daily take daily with meals for bladder emptying.  #30  11 Refill(s)            Schedule Return Visit/Planned Activity: Next Available Appointment - Schedule Surgery

## 2017-08-19 ENCOUNTER — Encounter (HOSPITAL_BASED_OUTPATIENT_CLINIC_OR_DEPARTMENT_OTHER)
Admission: RE | Disposition: A | Discharge status: Home / Self Care | Payer: Self-pay | Source: Ambulatory Visit | Attending: Urology

## 2017-08-19 ENCOUNTER — Ambulatory Visit (HOSPITAL_BASED_OUTPATIENT_CLINIC_OR_DEPARTMENT_OTHER)
Admission: RE | Admit: 2017-08-19 | Discharge: 2017-08-20 | Disposition: A | Discharge status: Home / Self Care | Payer: 59 | Source: Ambulatory Visit | Attending: Urology | Admitting: Urology

## 2017-08-19 ENCOUNTER — Ambulatory Visit (HOSPITAL_BASED_OUTPATIENT_CLINIC_OR_DEPARTMENT_OTHER): Payer: 59 | Admitting: Certified Registered Nurse Anesthetist

## 2017-08-19 ENCOUNTER — Encounter (HOSPITAL_BASED_OUTPATIENT_CLINIC_OR_DEPARTMENT_OTHER): Payer: Self-pay | Admitting: *Deleted

## 2017-08-19 DIAGNOSIS — Z87891 Personal history of nicotine dependence: Secondary | ICD-10-CM | POA: Diagnosis not present

## 2017-08-19 DIAGNOSIS — T148XXA Other injury of unspecified body region, initial encounter: Secondary | ICD-10-CM | POA: Diagnosis not present

## 2017-08-19 DIAGNOSIS — E785 Hyperlipidemia, unspecified: Secondary | ICD-10-CM | POA: Insufficient documentation

## 2017-08-19 DIAGNOSIS — N400 Enlarged prostate without lower urinary tract symptoms: Secondary | ICD-10-CM | POA: Insufficient documentation

## 2017-08-19 DIAGNOSIS — Z79899 Other long term (current) drug therapy: Secondary | ICD-10-CM | POA: Diagnosis not present

## 2017-08-19 DIAGNOSIS — Z823 Family history of stroke: Secondary | ICD-10-CM | POA: Insufficient documentation

## 2017-08-19 DIAGNOSIS — E7800 Pure hypercholesterolemia, unspecified: Secondary | ICD-10-CM | POA: Diagnosis not present

## 2017-08-19 DIAGNOSIS — R58 Hemorrhage, not elsewhere classified: Secondary | ICD-10-CM

## 2017-08-19 DIAGNOSIS — Z841 Family history of disorders of kidney and ureter: Secondary | ICD-10-CM | POA: Insufficient documentation

## 2017-08-19 DIAGNOSIS — N430 Encysted hydrocele: Secondary | ICD-10-CM | POA: Diagnosis not present

## 2017-08-19 DIAGNOSIS — D62 Acute posthemorrhagic anemia: Secondary | ICD-10-CM | POA: Diagnosis not present

## 2017-08-19 DIAGNOSIS — I10 Essential (primary) hypertension: Secondary | ICD-10-CM | POA: Diagnosis not present

## 2017-08-19 DIAGNOSIS — N433 Hydrocele, unspecified: Secondary | ICD-10-CM | POA: Insufficient documentation

## 2017-08-19 DIAGNOSIS — Z7982 Long term (current) use of aspirin: Secondary | ICD-10-CM | POA: Diagnosis not present

## 2017-08-19 HISTORY — PX: HYDROCELE EXCISION: SHX482

## 2017-08-19 HISTORY — DX: Presence of spectacles and contact lenses: Z97.3

## 2017-08-19 HISTORY — DX: Nodular prostate with lower urinary tract symptoms: N40.3

## 2017-08-19 HISTORY — DX: Elevated prostate specific antigen (PSA): R97.20

## 2017-08-19 HISTORY — DX: Hyperlipidemia, unspecified: E78.5

## 2017-08-19 HISTORY — DX: Hydrocele, unspecified: N43.3

## 2017-08-19 HISTORY — DX: Cyst of kidney, acquired: N28.1

## 2017-08-19 HISTORY — DX: Male erectile dysfunction, unspecified: N52.9

## 2017-08-19 HISTORY — DX: Unspecified osteoarthritis, unspecified site: M19.90

## 2017-08-19 LAB — HEMOGLOBIN AND HEMATOCRIT, BLOOD
HEMATOCRIT: 38.8 % — AB (ref 39.0–52.0)
HEMOGLOBIN: 12.3 g/dL — AB (ref 13.0–17.0)

## 2017-08-19 SURGERY — HYDROCELECTOMY
Anesthesia: General | Site: Groin | Laterality: Left

## 2017-08-19 MED ORDER — ZOLPIDEM TARTRATE 5 MG PO TABS
ORAL_TABLET | ORAL | Status: AC
  Filled 2017-08-19: qty 1

## 2017-08-19 MED ORDER — MORPHINE SULFATE (PF) 2 MG/ML IV SOLN
INTRAVENOUS | Status: AC
  Filled 2017-08-19: qty 1

## 2017-08-19 MED ORDER — OXYCODONE HCL 5 MG PO TABS
5.0000 mg | ORAL_TABLET | Freq: Once | ORAL | Status: DC | PRN
  Filled 2017-08-19: qty 1

## 2017-08-19 MED ORDER — EPHEDRINE 5 MG/ML INJ
INTRAVENOUS | Status: AC
  Filled 2017-08-19: qty 10

## 2017-08-19 MED ORDER — ZOLPIDEM TARTRATE 5 MG PO TABS
5.0000 mg | ORAL_TABLET | Freq: Every evening | ORAL | Status: DC | PRN
  Administered 2017-08-19: 5 mg via ORAL
  Filled 2017-08-19: qty 1

## 2017-08-19 MED ORDER — DEXAMETHASONE SODIUM PHOSPHATE 10 MG/ML IJ SOLN
INTRAMUSCULAR | Status: DC | PRN
  Administered 2017-08-19: 10 mg via INTRAVENOUS

## 2017-08-19 MED ORDER — SODIUM CHLORIDE 0.9% FLUSH
3.0000 mL | INTRAVENOUS | Status: DC | PRN
  Filled 2017-08-19: qty 3

## 2017-08-19 MED ORDER — LIDOCAINE HCL (CARDIAC) PF 100 MG/5ML IV SOSY
PREFILLED_SYRINGE | INTRAVENOUS | Status: DC | PRN
  Administered 2017-08-19: 40 mg via INTRAVENOUS

## 2017-08-19 MED ORDER — LACTATED RINGERS IV SOLN
INTRAVENOUS | Status: DC
  Administered 2017-08-19: 07:00:00 via INTRAVENOUS
  Filled 2017-08-19: qty 1000

## 2017-08-19 MED ORDER — DEXAMETHASONE SODIUM PHOSPHATE 10 MG/ML IJ SOLN
INTRAMUSCULAR | Status: AC
  Filled 2017-08-19: qty 1

## 2017-08-19 MED ORDER — FENTANYL CITRATE (PF) 100 MCG/2ML IJ SOLN
INTRAMUSCULAR | Status: AC
  Filled 2017-08-19: qty 2

## 2017-08-19 MED ORDER — PROPOFOL 10 MG/ML IV BOLUS
INTRAVENOUS | Status: AC
  Filled 2017-08-19: qty 20

## 2017-08-19 MED ORDER — ACETAMINOPHEN 650 MG RE SUPP
650.0000 mg | RECTAL | Status: DC | PRN
  Filled 2017-08-19: qty 1

## 2017-08-19 MED ORDER — ONDANSETRON HCL 4 MG/2ML IJ SOLN
INTRAMUSCULAR | Status: AC
  Filled 2017-08-19: qty 2

## 2017-08-19 MED ORDER — SUCCINYLCHOLINE CHLORIDE 20 MG/ML IJ SOLN
INTRAMUSCULAR | Status: DC | PRN
  Administered 2017-08-19: 60 mg via INTRAVENOUS

## 2017-08-19 MED ORDER — ONDANSETRON HCL 4 MG/2ML IJ SOLN
INTRAMUSCULAR | Status: DC | PRN
  Administered 2017-08-19: 4 mg via INTRAVENOUS

## 2017-08-19 MED ORDER — SODIUM CHLORIDE 0.9 % IV SOLN
250.0000 mL | INTRAVENOUS | Status: DC | PRN
  Filled 2017-08-19: qty 250

## 2017-08-19 MED ORDER — OXYCODONE HCL 5 MG PO TABS
5.0000 mg | ORAL_TABLET | ORAL | Status: DC | PRN
  Administered 2017-08-19 – 2017-08-20 (×2): 5 mg via ORAL
  Filled 2017-08-19: qty 2

## 2017-08-19 MED ORDER — ALFUZOSIN HCL ER 10 MG PO TB24
10.0000 mg | ORAL_TABLET | Freq: Every evening | ORAL | Status: DC
  Administered 2017-08-19: 10 mg via ORAL
  Filled 2017-08-19 (×2): qty 1

## 2017-08-19 MED ORDER — FENTANYL CITRATE (PF) 100 MCG/2ML IJ SOLN
INTRAMUSCULAR | Status: DC | PRN
  Administered 2017-08-19: 50 ug via INTRAVENOUS
  Administered 2017-08-19 (×2): 25 ug via INTRAVENOUS

## 2017-08-19 MED ORDER — MORPHINE SULFATE (PF) 2 MG/ML IV SOLN
2.0000 mg | INTRAVENOUS | Status: DC | PRN
  Administered 2017-08-19: 2 mg via INTRAVENOUS
  Filled 2017-08-19: qty 1

## 2017-08-19 MED ORDER — PROMETHAZINE HCL 25 MG/ML IJ SOLN
6.2500 mg | INTRAMUSCULAR | Status: DC | PRN
  Filled 2017-08-19: qty 1

## 2017-08-19 MED ORDER — MIDAZOLAM HCL 2 MG/2ML IJ SOLN
INTRAMUSCULAR | Status: DC | PRN
  Administered 2017-08-19: 1 mg via INTRAVENOUS

## 2017-08-19 MED ORDER — BUPIVACAINE HCL (PF) 0.25 % IJ SOLN
INTRAMUSCULAR | Status: DC | PRN
  Administered 2017-08-19: 9 mL

## 2017-08-19 MED ORDER — MIDAZOLAM HCL 2 MG/2ML IJ SOLN
INTRAMUSCULAR | Status: AC
  Filled 2017-08-19: qty 2

## 2017-08-19 MED ORDER — LIDOCAINE 2% (20 MG/ML) 5 ML SYRINGE
INTRAMUSCULAR | Status: AC
  Filled 2017-08-19: qty 5

## 2017-08-19 MED ORDER — GLYCOPYRROLATE 0.2 MG/ML IJ SOLN
INTRAMUSCULAR | Status: DC | PRN
  Administered 2017-08-19: 0.2 mg via INTRAVENOUS

## 2017-08-19 MED ORDER — SODIUM CHLORIDE 0.9 % IR SOLN
Status: DC | PRN
  Administered 2017-08-19: 500 mL

## 2017-08-19 MED ORDER — CEFAZOLIN SODIUM-DEXTROSE 2-4 GM/100ML-% IV SOLN
INTRAVENOUS | Status: AC
  Filled 2017-08-19: qty 100

## 2017-08-19 MED ORDER — PHENYLEPHRINE 40 MCG/ML (10ML) SYRINGE FOR IV PUSH (FOR BLOOD PRESSURE SUPPORT)
PREFILLED_SYRINGE | INTRAVENOUS | Status: AC
  Filled 2017-08-19: qty 10

## 2017-08-19 MED ORDER — PHENYLEPHRINE 40 MCG/ML (10ML) SYRINGE FOR IV PUSH (FOR BLOOD PRESSURE SUPPORT)
PREFILLED_SYRINGE | INTRAVENOUS | Status: DC | PRN
  Administered 2017-08-19 (×3): 80 ug via INTRAVENOUS

## 2017-08-19 MED ORDER — OXYCODONE HCL 5 MG PO TABS
ORAL_TABLET | ORAL | Status: AC
  Filled 2017-08-19: qty 1

## 2017-08-19 MED ORDER — FENTANYL CITRATE (PF) 100 MCG/2ML IJ SOLN
25.0000 ug | INTRAMUSCULAR | Status: DC | PRN
  Administered 2017-08-19: 50 ug via INTRAVENOUS
  Filled 2017-08-19: qty 1

## 2017-08-19 MED ORDER — PROPOFOL 10 MG/ML IV BOLUS
INTRAVENOUS | Status: DC | PRN
  Administered 2017-08-19: 150 mg via INTRAVENOUS
  Administered 2017-08-19: 50 mg via INTRAVENOUS

## 2017-08-19 MED ORDER — EPHEDRINE SULFATE-NACL 50-0.9 MG/10ML-% IV SOSY
PREFILLED_SYRINGE | INTRAVENOUS | Status: DC | PRN
  Administered 2017-08-19: 5 mg via INTRAVENOUS

## 2017-08-19 MED ORDER — SUCCINYLCHOLINE CHLORIDE 200 MG/10ML IV SOSY
PREFILLED_SYRINGE | INTRAVENOUS | Status: AC
  Filled 2017-08-19: qty 10

## 2017-08-19 MED ORDER — ACETAMINOPHEN 325 MG PO TABS
650.0000 mg | ORAL_TABLET | ORAL | Status: DC | PRN
  Filled 2017-08-19: qty 2

## 2017-08-19 MED ORDER — CEFAZOLIN SODIUM-DEXTROSE 2-4 GM/100ML-% IV SOLN
2.0000 g | INTRAVENOUS | Status: AC
  Administered 2017-08-19: 2 g via INTRAVENOUS
  Filled 2017-08-19: qty 100

## 2017-08-19 MED ORDER — GLYCOPYRROLATE PF 0.2 MG/ML IJ SOSY
PREFILLED_SYRINGE | INTRAMUSCULAR | Status: AC
  Filled 2017-08-19: qty 1

## 2017-08-19 MED ORDER — SODIUM CHLORIDE 0.9% FLUSH
3.0000 mL | Freq: Two times a day (BID) | INTRAVENOUS | Status: DC
Start: 2017-08-19 — End: 2017-08-20
  Filled 2017-08-19: qty 3

## 2017-08-19 MED ORDER — ROSUVASTATIN CALCIUM 10 MG PO TABS
10.0000 mg | ORAL_TABLET | Freq: Every evening | ORAL | Status: DC
  Administered 2017-08-19: 10 mg via ORAL
  Filled 2017-08-19 (×2): qty 1

## 2017-08-19 MED ORDER — OXYCODONE HCL 5 MG/5ML PO SOLN
5.0000 mg | Freq: Once | ORAL | Status: DC | PRN
  Filled 2017-08-19: qty 5

## 2017-08-19 MED ORDER — HYDROCODONE-ACETAMINOPHEN 5-325 MG PO TABS: 1.0000 | ORAL_TABLET | Freq: Four times a day (QID) | ORAL | 0 refills | Status: DC | PRN

## 2017-08-19 MED FILL — HYDROCODON-APAP 5-325: 5-325 | 2 days supply | Qty: 10 | Fill #0

## 2017-08-19 SURGICAL SUPPLY — 40 items
BLADE CLIPPER SURG (BLADE) ×2 IMPLANT
BLADE SURG 15 STRL LF DISP TIS (BLADE) ×1 IMPLANT
BLADE SURG 15 STRL SS (BLADE) ×1
BNDG GAUZE ELAST 4 BULKY (GAUZE/BANDAGES/DRESSINGS) ×2 IMPLANT
CANISTER SUCT 3000ML PPV (MISCELLANEOUS) IMPLANT
CANISTER SUCTION 1200CC (MISCELLANEOUS) IMPLANT
CLEANER CAUTERY TIP 5X5 PAD (MISCELLANEOUS) IMPLANT
COVER BACK TABLE 60X90IN (DRAPES) ×2 IMPLANT
COVER MAYO STAND STRL (DRAPES) ×2 IMPLANT
DERMABOND ADVANCED (GAUZE/BANDAGES/DRESSINGS) ×1
DERMABOND ADVANCED .7 DNX12 (GAUZE/BANDAGES/DRESSINGS) ×1 IMPLANT
DISSECTOR ROUND CHERRY 3/8 STR (MISCELLANEOUS) IMPLANT
DRAIN PENROSE 18X1/4 LTX STRL (WOUND CARE) IMPLANT
DRAPE LAPAROTOMY 100X72 PEDS (DRAPES) ×2 IMPLANT
ELECT REM PT RETURN 9FT ADLT (ELECTROSURGICAL) ×2
ELECTRODE REM PT RTRN 9FT ADLT (ELECTROSURGICAL) ×1 IMPLANT
GLOVE BIO SURGEON STRL SZ 6.5 (GLOVE) ×2 IMPLANT
GLOVE BIO SURGEON STRL SZ7 (GLOVE) ×2 IMPLANT
GLOVE BIOGEL PI IND STRL 7.0 (GLOVE) ×1 IMPLANT
GLOVE BIOGEL PI INDICATOR 7.0 (GLOVE) ×1
GLOVE SURG SS PI 8.0 STRL IVOR (GLOVE) ×2 IMPLANT
GOWN STRL REUS W/ TWL LRG LVL3 (GOWN DISPOSABLE) ×2 IMPLANT
GOWN STRL REUS W/ TWL XL LVL3 (GOWN DISPOSABLE) ×1 IMPLANT
GOWN STRL REUS W/TWL LRG LVL3 (GOWN DISPOSABLE) ×2
GOWN STRL REUS W/TWL XL LVL3 (GOWN DISPOSABLE) ×1
KIT TURNOVER CYSTO (KITS) ×2 IMPLANT
MANIFOLD NEPTUNE II (INSTRUMENTS) IMPLANT
NEEDLE HYPO 25X1 1.5 SAFETY (NEEDLE) ×2 IMPLANT
NS IRRIG 500ML POUR BTL (IV SOLUTION) ×2 IMPLANT
PACK BASIN DAY SURGERY FS (CUSTOM PROCEDURE TRAY) ×2 IMPLANT
PAD ABD 8X10 STRL (GAUZE/BANDAGES/DRESSINGS) ×2 IMPLANT
PAD CLEANER CAUTERY TIP 5X5 (MISCELLANEOUS)
PENCIL BUTTON HOLSTER BLD 10FT (ELECTRODE) ×4 IMPLANT
SUPPORT SCROTAL LG STRP (MISCELLANEOUS) ×2 IMPLANT
SUT CHROMIC 3 0 SH 27 (SUTURE) ×4 IMPLANT
SYR CONTROL 10ML LL (SYRINGE) ×2 IMPLANT
TOWEL OR 17X24 6PK STRL BLUE (TOWEL DISPOSABLE) ×4 IMPLANT
TRAY DSU PREP LF (CUSTOM PROCEDURE TRAY) ×2 IMPLANT
TUBE CONNECTING 12X1/4 (SUCTIONS) ×2 IMPLANT
YANKAUER SUCT BULB TIP NO VENT (SUCTIONS) ×2 IMPLANT

## 2017-08-19 NOTE — Anesthesia Postprocedure Evaluation (Signed)
Anesthesia Post Note  Patient: Nicholas Houston  Procedure(s) Performed: HYDROCELECTOMY ADULT (Left Groin)     Patient location during evaluation: PACU Anesthesia Type: General Level of consciousness: awake and alert and oriented Pain management: pain level controlled Vital Signs Assessment: post-procedure vital signs reviewed and stable Respiratory status: spontaneous breathing, nonlabored ventilation and respiratory function stable Cardiovascular status: blood pressure returned to baseline and stable Anesthetic complications: no Comments: Patient has had persistent oozing from incision site. Evaluated by Dr. Annabell HowellsWrenn, Pressure applied to groin. Dr. Annabell HowellsWrenn to re evaluate patient.    Last Vitals:  Vitals:   08/19/17 1600 08/19/17 1620  BP: 120/81 114/69  Pulse: 70 64  Resp: 15 16  Temp:  36.5 C  SpO2: 97% 95%    Last Pain:  Vitals:   08/19/17 1620  TempSrc: Oral  PainSc:                  Daquann Merriott A.

## 2017-08-19 NOTE — Progress Notes (Addendum)
Dr. Annabell HowellsWrenn by to see patient after reporting continued drainage from penrose drain and behind the drain.  Dr. Annabell HowellsWrenn applied pressure dressing and gel bags to pubic area, and to will return in 1 hour to recheck.  Will continue to monitor with VS.

## 2017-08-19 NOTE — Anesthesia Procedure Notes (Signed)
Procedure Name: LMA Insertion Date/Time: 08/19/2017 9:21 AM Performed by: Yolonda Kidaarver, Crewe Heathman L, CRNA Pre-anesthesia Checklist: Patient identified, Emergency Drugs available, Suction available and Patient being monitored Patient Re-evaluated:Patient Re-evaluated prior to induction Oxygen Delivery Method: Circle system utilized Preoxygenation: Pre-oxygenation with 100% oxygen Induction Type: IV induction Ventilation: Mask ventilation without difficulty LMA: LMA inserted LMA Size: 4.0 Number of attempts: 1 Placement Confirmation: positive ETCO2,  CO2 detector and breath sounds checked- equal and bilateral Tube secured with: Tape Dental Injury: Teeth and Oropharynx as per pre-operative assessment

## 2017-08-19 NOTE — Transfer of Care (Signed)
Immediate Anesthesia Transfer of Care Note  Patient: Nicholas Houston  Procedure(s) Performed: HYDROCELECTOMY ADULT (Left Groin)  Patient Location: PACU  Anesthesia Type:General  Level of Consciousness: awake, alert , oriented and patient cooperative  Airway & Oxygen Therapy: Patient Spontanous Breathing and Patient connected to nasal cannula oxygen  Post-op Assessment: Report given to RN and Post -op Vital signs reviewed and stable  Post vital signs: Reviewed and stable  Last Vitals:  Vitals Value Taken Time  BP 143/98 08/19/2017 10:30 AM  Temp    Pulse 100 08/19/2017 10:31 AM  Resp 15 08/19/2017 10:31 AM  SpO2 99 % 08/19/2017 10:31 AM  Vitals shown include unvalidated device data.  Last Pain:  Vitals:   08/19/17 0642  TempSrc: Oral         Complications: No apparent anesthesia complications

## 2017-08-19 NOTE — Anesthesia Postprocedure Evaluation (Signed)
Anesthesia Post Note  Patient: Nicholas Houston  Procedure(s) Performed: HYDROCELECTOMY ADULT (Left Groin)     Patient location during evaluation: PACU Anesthesia Type: General Level of consciousness: awake and alert Pain management: pain level controlled Vital Signs Assessment: post-procedure vital signs reviewed and stable Respiratory status: spontaneous breathing, nonlabored ventilation, respiratory function stable and patient connected to nasal cannula oxygen Cardiovascular status: blood pressure returned to baseline and stable Postop Assessment: no apparent nausea or vomiting Anesthetic complications: no    Last Vitals:  Vitals:   08/19/17 1100 08/19/17 1115  BP: (!) 153/100   Pulse: 72   Resp: 12   Temp:  36.4 C  SpO2: 99%     Last Pain:  Vitals:   08/19/17 1115  TempSrc:   PainSc: 0-No pain                 Harlie Buening,Stephone S

## 2017-08-19 NOTE — Discharge Instructions (Addendum)
Hydrocelectomy, Adult, Care After This sheet gives you information about how to care for yourself after your procedure. Your health care provider may also give you more specific instructions. If you have problems or questions, contact your health care provider. What can I expect after the procedure? After your procedure, it is common to have mild discomfort, swelling, and bruising in the pouch that holds your testicles (scrotum). Follow these instructions at home: Bathing  Ask your health care provider when you can shower, take baths, or go swimming.  If you were told to wear an athletic support strap, take it off when you shower or take a bath. Incision care  Follow instructions from your health care provider about how to take care of your incision. Make sure you: ? Wash your hands with soap and water before you change your bandage (dressing). If soap and water are not available, use hand sanitizer. ? Change your dressing as told by your health care provider. ? Leave stitches (sutures) in place.  Check your incision and scrotum every day for signs of infection. Check for: ? More redness, swelling, or pain. ? Blood or fluid. ? Warmth. ? Pus or a bad smell. Managing pain, stiffness, and swelling  If directed, apply ice to the injured area: ? Put ice in a plastic bag. ? Place a towel between your skin and the bag. ? Leave the ice on for 20 minutes, 2-3 times per day. Driving  Do not drive for 24 hours if you were given a sedative.  Do not drive or use heavy machinery while taking prescription pain medicine.  Ask your health care provider when it is safe to drive. Activity  Do not do any activities that require great strength and energy (are vigorous) for as long as told by your health care provider.  Return to your normal activities as told by your health care provider. Ask your health care provider what activities are safe for you.  Do not lift anything that is heavier than 10  lb (4.5 kg) until your health care provider says that it is safe. General instructions  Take over-the-counter and prescription medicines only as told by your health care provider.  Keep all follow-up visits as told by your health care provider. This is important.  If you were given an athletic support strap, wear it as told by your health care provider.  If you had a drain put in during the procedure, you will need to return for a follow-up visit to have it removed. Contact a health care provider if:  Your pain is not controlled with medicine.  You have more redness or swelling around your scrotum.  You have blood or fluid coming from your scrotum.  Your incision feels warm to the touch.  You have pus or a bad smell coming from your scrotum.  You have a fever.  You may remove the drain in the morning and change the dressing as needed.  Ice pack to scrotal area for 4-6 hours today.  This information is not intended to replace advice given to you by your health care provider. Make sure you discuss any questions you have with your health care provider. Document Released: 10/03/2014 Document Revised: 10/12/2015 Document Reviewed: 10/12/2015 Elsevier Interactive Patient Education  2018 [employer]    Post Anesthesia Home Care Instructions  Activity: Get plenty of rest for the remainder of the day. A responsible individual must stay with you for 24 hours following the procedure.  For the next  24 hours, DO NOT: -Drive a car -Advertising copywriter -Drink alcoholic beverages -Take any medication unless instructed by your physician -Make any legal decisions or sign important papers.  Meals: Start with liquid foods such as gelatin or soup. Progress to regular foods as tolerated. Avoid greasy, spicy, heavy foods. If nausea and/or vomiting occur, drink only clear liquids until the nausea and/or vomiting subsides. Call your physician if vomiting continues.  Special  Instructions/Symptoms: Your throat may feel dry or sore from the anesthesia or the breathing tube placed in your throat during surgery. If this causes discomfort, gargle with warm salt water. The discomfort should disappear within 24 hours.  If you had a scopolamine patch placed behind your ear for the management of post- operative nausea and/or vomiting:  1. The medication in the patch is effective for 72 hours, after which it should be removed.  Wrap patch in a tissue and discard in the trash. Wash hands thoroughly with soap and water. 2. You may remove the patch earlier than 72 hours if you experience unpleasant side effects which may include dry mouth, dizziness or visual disturbances. 3. Avoid touching the patch. Wash your hands with soap and water after contact with the patch.

## 2017-08-19 NOTE — Op Note (Addendum)
Procedure: Left hydrocelectomy.  Preop diagnosis: Left hydrocele.  Postop diagnosis: Same.  Surgeon: Dr. Bjorn PippinJohn Ceaira Ernster.  Anesthesia: General.  Specimen: Hydrocele sac.  Drains: Quarter-inch Penrose drain.  EBL: Minimal.  Complications: None.  Indications: Mr. Nicholas Houston is a 67 year old white male with a large left hydrocele with an inguinal component.  He is elected to undergo hydrocelectomy.  Procedure: He was given 2 g of Ancef.  A general anesthetic was induced with him in the supine position.  His left inguinal area and scrotum were clipped and he was prepped with Betadine solution and draped in usual sterile fashion.  After examining him under anesthesia was felt an inguinal approach the hydrocele believe the most likely to avoid a secondary incision.  The incision was made with a knife along skin lines in the low inguinal area approximately 5 cm in length.  The Bovie was used to complete the incision through the subcutaneous tissue.  The external oblique fascia was identified and opened over the inguinal component to the hydrocele.  Additionally dissection of the scrotal component was initiated using blunt finger dissection.  During this portion of the dissection the hydrocele sac was actually entered and drained since it was felt that that would allow the delivery the testicle and inguinal area most easily.  Once the hydrocele had been drained approximately 200 mL of fluid the hydrocele sac edges were identified and grasped and the surrounding tissues were bluntly dissected off the sac allowing delivery of the testicle and the entire hydrocele sac into the inguinal incision.  The excess sac was then excised and the residual sac was imbricated behind the testicle using a water bottle fashion with a running 3-0 chromic suture.  He was also noted to have a small spermatocele on the upper pole of the epididymis and this was opened and drained and oversewn with 3-0 chromic.  At this point  inspection of the testicle revealed a normal appearing well vascularized testicle.  Hemostasis in the scrotal component of the hydrocele was ensured after irrigation of the scrotum and the testicle was returned to the left hemiscrotum.  1/4 inch Penrose drain was placed through separate stab wound in the dependent portion of the left scrotum and laid up into the inguinal canal under the external ring.  The external oblique fascia was then closed using running 3-0 Vicryl suture.  Great care was taken to avoid entrapping the drain.  The ilioinguinal nerve was notclearly.  Identified during the procedure the subcutaneous tissue was reapproximated with interrupted 3-0 chromic sutures.  The wound was infiltrated with 9 mL of quarter percent Marcaine and the skin was closed using a running intracuticular 4-0 Monocryl suture.  The wound was cleaned, dried and then was reinforced with Dermabond.  The drain was trimmed to an appropriate length but not secured to the skin.  A dressing of 4 x 4's left Kerlix and scrotal support was applied.  Sponge, instrument and needle count were correct.   His anesthetic was reversed and he was moved to recovery room in stable condition.  There were no complications.

## 2017-08-19 NOTE — Interval H&P Note (Signed)
History and Physical Interval Note:  08/19/2017 8:40 AM  Delman CheadleGeorge W Venuti  has presented today for surgery, with the diagnosis of LEFT HYDROCELE  The various methods of treatment have been discussed with the patient and family. After consideration of risks, benefits and other options for treatment, the patient has consented to  Procedure(s): HYDROCELECTOMY ADULT (Left) as a surgical intervention .  The patient's history has been reviewed, patient examined, no change in status, stable for surgery.  I have reviewed the patient's chart and labs.  Questions were answered to the patient's satisfaction.     Bjorn PippinJohn Mesa Janus

## 2017-08-19 NOTE — Anesthesia Preprocedure Evaluation (Signed)
Anesthesia Evaluation  Patient identified by MRN, date of birth, ID band Patient awake    Reviewed: Allergy & Precautions, NPO status , Patient's Chart, lab work & pertinent test results  Airway Mallampati: II  TM Distance: >3 FB Neck ROM: Full    Dental no notable dental hx.    Pulmonary neg pulmonary ROS, former smoker,    Pulmonary exam normal breath sounds clear to auscultation       Cardiovascular negative cardio ROS Normal cardiovascular exam Rhythm:Regular Rate:Normal     Neuro/Psych negative neurological ROS  negative psych ROS   GI/Hepatic negative GI ROS, Neg liver ROS,   Endo/Other  negative endocrine ROS  Renal/GU negative Renal ROS  negative genitourinary   Musculoskeletal negative musculoskeletal ROS (+)   Abdominal   Peds negative pediatric ROS (+)  Hematology negative hematology ROS (+)   Anesthesia Other Findings   Reproductive/Obstetrics negative OB ROS                             Anesthesia Physical Anesthesia Plan  ASA: II  Anesthesia Plan: General   Post-op Pain Management:    Induction: Intravenous  PONV Risk Score and Plan: 2 and Ondansetron, Dexamethasone and Treatment may vary due to age or medical condition  Airway Management Planned: LMA  Additional Equipment:   Intra-op Plan:   Post-operative Plan: Extubation in OR  Informed Consent: I have reviewed the patients History and Physical, chart, labs and discussed the procedure including the risks, benefits and alternatives for the proposed anesthesia with the patient or authorized representative who has indicated his/her understanding and acceptance.   Dental advisory given  Plan Discussed with: CRNA and Surgeon  Anesthesia Plan Comments:         Anesthesia Quick Evaluation  

## 2017-08-19 NOTE — Progress Notes (Signed)
Gel packs to lower abd/pubis to be removed in 30 minutes per Dr. Annabell HowellsWrenn.

## 2017-08-19 NOTE — Addendum Note (Signed)
Addendum  created 08/19/17 1633 by Mal AmabileFoster, Josephanthony Tindel, MD   Sign clinical note

## 2017-08-19 NOTE — Progress Notes (Signed)
Mr. Nicholas Houston was found to have persistent bleeding after surgery.  A Turban dressing was applied and he was placed back at bedrest with compression on the groin about 1245.  He has been followed since and at 4 the compression was removed from the groin.  On reexamination there is no obvious hematoma in the groin and the scrotal dressing has some blood that has soaked through it appears to be a diminishing amount.    .BP 114/69 (BP Location: Right Arm, Patient Position: Sitting)   Pulse 64   Temp 97.7 F (36.5 C) (Oral)   Resp 16   Ht 5\' 11"  (1.803 m)   Wt 165 lb 3.2 oz (74.9 kg)   SpO2 95%   BMI 23.04 kg/m .  After reviewing the options with the patient and his wife of continued observation in recovery care or discharge home with return if the bleeding worsens, I am going to admit him for overnight observation and will check an H&H tonight and in the morning and keep the current dressing in place with an ice pack to the groin.  He will be kept on bedrest.

## 2017-08-20 ENCOUNTER — Encounter (HOSPITAL_BASED_OUTPATIENT_CLINIC_OR_DEPARTMENT_OTHER): Payer: Self-pay | Admitting: Urology

## 2017-08-20 DIAGNOSIS — D62 Acute posthemorrhagic anemia: Secondary | ICD-10-CM | POA: Diagnosis not present

## 2017-08-20 DIAGNOSIS — N400 Enlarged prostate without lower urinary tract symptoms: Secondary | ICD-10-CM | POA: Diagnosis not present

## 2017-08-20 DIAGNOSIS — E785 Hyperlipidemia, unspecified: Secondary | ICD-10-CM | POA: Diagnosis not present

## 2017-08-20 DIAGNOSIS — Z87891 Personal history of nicotine dependence: Secondary | ICD-10-CM | POA: Diagnosis not present

## 2017-08-20 DIAGNOSIS — E7800 Pure hypercholesterolemia, unspecified: Secondary | ICD-10-CM | POA: Diagnosis not present

## 2017-08-20 DIAGNOSIS — Z79899 Other long term (current) drug therapy: Secondary | ICD-10-CM | POA: Diagnosis not present

## 2017-08-20 DIAGNOSIS — Z7982 Long term (current) use of aspirin: Secondary | ICD-10-CM | POA: Diagnosis not present

## 2017-08-20 DIAGNOSIS — Z841 Family history of disorders of kidney and ureter: Secondary | ICD-10-CM | POA: Diagnosis not present

## 2017-08-20 DIAGNOSIS — Z823 Family history of stroke: Secondary | ICD-10-CM | POA: Diagnosis not present

## 2017-08-20 DIAGNOSIS — N433 Hydrocele, unspecified: Secondary | ICD-10-CM | POA: Diagnosis not present

## 2017-08-20 LAB — HEMOGLOBIN AND HEMATOCRIT, BLOOD
HEMATOCRIT: 37.2 % — AB (ref 39.0–52.0)
HEMOGLOBIN: 12 g/dL — AB (ref 13.0–17.0)

## 2017-08-20 MED ORDER — OXYCODONE HCL 5 MG PO TABS
ORAL_TABLET | ORAL | Status: AC
  Filled 2017-08-20: qty 1

## 2017-08-20 NOTE — Discharge Summary (Signed)
Physician Discharge Summary  Patient ID: Nicholas Houston MRN: [id number] DOB/AGE: [birth year] 67 y.o.  Admit date: 08/19/2017 Discharge date: 08/20/2017  Admission Diagnoses:  Left hydrocele  Discharge Diagnoses:  Principal Problem:   Left hydrocele Active Problems:   BPH (benign prostatic hyperplasia)   Hemorrhage from wound   Postoperative anemia due to acute blood loss   Past Medical History:  Diagnosis Date  . Arthritis   . ED (erectile dysfunction)   . Elevated PSA   . Hyperlipidemia   . Left hydrocele   . 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 Renal cyst, right   . Wears contact lenses     Surgeries: Procedure(s): HYDROCELECTOMY ADULT on 08/19/2017   Consultants (if any):   Discharged Condition: Improved  Hospital Course: Nicholas Houston is an 67 y.o. male who was admitted 08/19/2017 with a diagnosis of Left hydrocele and went to the operating room on 08/19/2017 and underwent the above named procedures.   I was called to the PACU postoperatively for persistent drainage.  He was found to have active bleeding from the drain site that was relatively brisk.  I applied a turban dressing to the scrotum and gel pads were used to compress the groin and after an hour the bleeding seemed to have stopped but he was admitted for observation to insure it didn't recur.   His Hgb was 12.3 in the afternoon and 12.0 this morning.  The last prior level was 13.5 in 2017.  The dressing and drain were removed this morning and there was no active bleeding.  He had some scrotal ecchymosis but no significant swelling of the groin or scrotum.  He was felt to be ready for discharge home.    He was given perioperative antibiotics:  Anti-infectives (From admission, onward)   Start     Dose/Rate Route Frequency Ordered Stop   08/19/17 0649  ceFAZolin (ANCEF) IVPB 2g/100 mL premix     2 g 200 mL/hr over 30 Minutes Intravenous 30 min pre-op 08/19/17 0649 08/19/17 0917    .  He was  given sequential compression devices for DVT prophylaxis.  He benefited maximally from the hospital stay and there were no complications.    Recent vital signs:  Vitals:   08/20/17 0001 08/20/17 0400  BP: 121/77 125/83  Pulse: 94 97  Resp: 16 16  Temp: 99 F (37.2 C) 99.2 F (37.3 C)  SpO2: 98% 98%    Recent laboratory studies:  Lab Results  Component Value Date   HGB 12.0 (L) 08/20/2017   HGB 12.3 (L) 08/19/2017   HGB 13.5 12/04/2015   Lab Results  Component Value Date   WBC 4.4 12/04/2015   PLT 214 12/04/2015   No results found for: INR Lab Results  Component Value Date   NA 140 05/05/2017   K 4.0 05/05/2017   CL 102 05/05/2017   CO2 24 05/05/2017   BUN 12 05/05/2017   CREATININE 1.15 05/05/2017   GLUCOSE 93 05/05/2017    Discharge Medications:   Allergies as of 08/20/2017   No Known Allergies     Medication List    TAKE these medications   acetaminophen 325 MG tablet Commonly known as:  TYLENOL Take 650 mg by mouth every 6 (six) hours as needed. For pain   alfuzosin 10 MG 24 hr tablet Commonly known as:  UROXATRAL Take 10 mg by mouth every evening.   ALLERGY EYE OP Apply to eye as needed.  aspirin EC 81 MG tablet Take 81 mg by mouth daily.   HYDROcodone-acetaminophen 5-325 MG tablet Commonly known as:  NORCO Take 1 tablet by mouth every 6 (six) hours as needed for moderate pain.   rosuvastatin 10 MG tablet Commonly known as:  CRESTOR Take 1 tablet (10 mg total) by mouth daily. What changed:  when to take this   sildenafil 100 MG tablet Commonly known as:  VIAGRA Take 0.5-1 tablets (50-100 mg total) by mouth daily as needed for erectile dysfunction.       Diagnostic Studies: No results found.  Disposition: Discharge disposition: 01-Home or Self Care         Follow-up Information    Hillery AldoGibson, Larry Ryan, NP On 09/01/2017.   Specialty:  Nurse Practitioner Why:  815am Contact information: [address] [place]  [id number] [phone]            Signed: Bjorn PippinJohn Jaquavius Hudler 08/20/2017, 6:39 AM

## 2017-09-01 DIAGNOSIS — H524 Presbyopia: Secondary | ICD-10-CM | POA: Diagnosis not present

## 2017-09-01 DIAGNOSIS — N430 Encysted hydrocele: Secondary | ICD-10-CM | POA: Diagnosis not present

## 2017-09-01 MED FILL — ALFUZOSIN HCL ER 10 MG TAB: 10 | 30 days supply | Qty: 30 | Fill #1

## 2017-10-01 MED FILL — ALFUZOSIN HCL ER 10 MG TAB: 10 | 30 days supply | Qty: 30 | Fill #2

## 2017-11-01 MED FILL — ALFUZOSIN HCL ER 10 MG TAB: 10 | 30 days supply | Qty: 30 | Fill #3

## 2017-11-08 MED FILL — ROSUVASTATIN CALCIUM 10 MG: 10 | 90 days supply | Qty: 90 | Fill #2

## 2017-12-02 MED FILL — ALFUZOSIN HCL ER 10 MG TAB: 10 | 30 days supply | Qty: 30 | Fill #4

## 2017-12-08 DIAGNOSIS — R3914 Feeling of incomplete bladder emptying: Secondary | ICD-10-CM | POA: Diagnosis not present

## 2017-12-08 DIAGNOSIS — N430 Encysted hydrocele: Secondary | ICD-10-CM | POA: Diagnosis not present

## 2017-12-08 DIAGNOSIS — N5201 Erectile dysfunction due to arterial insufficiency: Secondary | ICD-10-CM | POA: Diagnosis not present

## 2017-12-08 DIAGNOSIS — N403 Nodular prostate with lower urinary tract symptoms: Secondary | ICD-10-CM | POA: Diagnosis not present

## 2017-12-21 ENCOUNTER — Telehealth: Payer: Self-pay | Admitting: Family Medicine

## 2017-12-21 NOTE — Telephone Encounter (Signed)
Attempted to contact pt about overdue COL; sent directly to voicemail. -CH

## 2018-02-04 ENCOUNTER — Other Ambulatory Visit: Payer: Self-pay | Admitting: Family Medicine

## 2018-02-04 MED FILL — ROSUVASTATIN CALCIUM 10 MG: 10 | 90 days supply | Qty: 90 | Fill #0

## 2018-02-04 MED FILL — ALFUZOSIN HCL ER 10 MG TAB: 10 | 30 days supply | Qty: 30 | Fill #5

## 2018-03-07 MED FILL — ALFUZOSIN HCL ER 10 MG TAB: 10 | 30 days supply | Qty: 30 | Fill #6

## 2018-04-29 ENCOUNTER — Encounter: Payer: Self-pay | Admitting: Family Medicine

## 2018-09-06 ENCOUNTER — Ambulatory Visit (INDEPENDENT_AMBULATORY_CARE_PROVIDER_SITE_OTHER): Payer: 59 | Admitting: Family Medicine

## 2018-09-06 ENCOUNTER — Other Ambulatory Visit: Payer: Self-pay

## 2018-09-06 ENCOUNTER — Encounter: Payer: Self-pay | Admitting: Family Medicine

## 2018-09-06 VITALS — BP 170/96 | HR 74 | Wt 170.6 lb

## 2018-09-06 DIAGNOSIS — Z1211 Encounter for screening for malignant neoplasm of colon: Secondary | ICD-10-CM

## 2018-09-06 DIAGNOSIS — Z8679 Personal history of other diseases of the circulatory system: Secondary | ICD-10-CM

## 2018-09-06 DIAGNOSIS — Z0000 Encounter for general adult medical examination without abnormal findings: Secondary | ICD-10-CM

## 2018-09-06 DIAGNOSIS — E7800 Pure hypercholesterolemia, unspecified: Secondary | ICD-10-CM

## 2018-09-06 MED ORDER — ROSUVASTATIN CALCIUM 10 MG PO TABS: 10.0000 mg | ORAL_TABLET | Freq: Every day | ORAL | 1 refills | Status: DC

## 2018-09-06 MED FILL — ROSUVASTATIN CALCIUM 10 MG: 10 | 90 days supply | Qty: 90 | Fill #0

## 2018-09-06 NOTE — Patient Instructions (Addendum)
Good to see you today!  Thanks for coming in.  Overall things look very good  Your blood pressure needs to be followed. Please try to check at home and at stores and send the readings to me in one week and then in two weeks   I will call you if your tests are not good.  Otherwise I will send you a letter.  If you do not hear from me with in 2 weeks please call our office.     Take the crestor at least every day

## 2018-09-06 NOTE — Progress Notes (Signed)
Subjective  Nicholas Houston is a 68 y.o. male is presenting for a physical He feels well and has no concerns.  Has not been taking his statin since he felt he was eating better.  Patient reports no  vision/ hearing changes,anorexia, weight change, fever ,adenopathy, persistant / recurrent hoarseness, swallowing issues, chest pain, edema,persistant / recurrent cough, hemoptysis, dyspnea(rest, exertional, paroxysmal nocturnal), gastrointestinal  bleeding (melena, rectal bleeding), abdominal pain, excessive heart burn, GU symptoms(dysuria, hematuria, pyuria, voiding/incontinence  Issues) syncope, focal weakness, severe memory loss, concerning skin lesions, depression, anxiety, abnormal bruising/bleeding, major joint swelling.      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    Objective Vital Signs reviewed BP (!) 170/96   Pulse 74   Wt 170 lb 9.6 oz (77.4 kg)   SpO2 97%   BMI 23.79 kg/m  Neck:  No deformities, thyromegaly, masses, or tenderness noted.   Supple with full range of motion without pain. Heart - Regular rate and rhythm.  No murmurs, gallops or rubs.    Lungs:  Normal respiratory effort, chest expands symmetrically. Lungs are clear to auscultation, no crackles or wheezes. Abdomen: soft and non-tender without masses, organomegaly or hernias noted.  No guarding or rebound Extremities:  No cyanosis, edema, or deformity noted with good range of motion of all major joints.   Skin:  Intact without suspicious lesions or rashes Ears:  External ear exam shows no significant lesions or deformities.  Otoscopic examination reveals clear canals, tympanic membranes are intact bilaterally without bulging, retraction, inflammation or discharge. Hearing is grossly normal bilaterall  Assessments/Plans  HM - send cologaurd   History of  hypertension BP Readings from Last 3 Encounters:  09/06/18 (!) 170/96  08/20/17 121/73  05/18/17 125/65   Elevated today.  Will check labs and monitor closely at home.  He is unsure if he was ever treated with blood pressure medication.  May need to review salt intake and potential NSAID use   Pure hypercholesterolemia Will resume crestor and check labs in a few months

## 2018-09-06 NOTE — Assessment & Plan Note (Addendum)
BP Readings from Last 3 Encounters:  09/06/18 (!) 170/96  08/20/17 121/73  05/18/17 125/65   Elevated today.  Will check labs and monitor closely at home.  He is unsure if he was ever treated with blood pressure medication.  May need to review salt intake and potential NSAID use

## 2018-09-06 NOTE — Assessment & Plan Note (Signed)
Will resume crestor and check labs in a few months

## 2018-09-07 ENCOUNTER — Encounter: Payer: Self-pay | Admitting: Family Medicine

## 2018-09-07 LAB — CMP14+EGFR
ALT: 17 IU/L (ref 0–44)
AST: 19 IU/L (ref 0–40)
Albumin/Globulin Ratio: 1.7 (ref 1.2–2.2)
Albumin: 4.2 g/dL (ref 3.8–4.8)
Alkaline Phosphatase: 42 IU/L (ref 39–117)
BUN/Creatinine Ratio: 16 (ref 10–24)
BUN: 18 mg/dL (ref 8–27)
Bilirubin Total: 0.4 mg/dL (ref 0.0–1.2)
CO2: 22 mmol/L (ref 20–29)
Calcium: 9.3 mg/dL (ref 8.6–10.2)
Chloride: 102 mmol/L (ref 96–106)
Creatinine, Ser: 1.13 mg/dL (ref 0.76–1.27)
GFR calc Af Amer: 76 mL/min/{1.73_m2} (ref >59)
GFR calc non Af Amer: 66 mL/min/{1.73_m2} (ref >59)
Globulin, Total: 2.5 g/dL (ref 1.5–4.5)
Glucose: 90 mg/dL (ref 65–99)
Potassium: 3.8 mmol/L (ref 3.5–5.2)
Sodium: 138 mmol/L (ref 134–144)
Total Protein: 6.7 g/dL (ref 6.0–8.5)

## 2018-09-21 ENCOUNTER — Encounter: Payer: Self-pay | Admitting: Family Medicine

## 2018-09-27 MED ORDER — LOSARTAN POTASSIUM 50 MG PO TABS: 50.0000 mg | ORAL_TABLET | Freq: Every day | ORAL | 1 refills | Status: DC

## 2018-09-27 MED FILL — LOSARTAN POTASSIUM 50 MG TA: 50 | 30 days supply | Qty: 30 | Fill #0

## 2018-09-27 NOTE — Telephone Encounter (Signed)
Pls let Mr Ruel know I sent his this message and also give it to him over the phone  Thanks much  L

## 2018-09-29 ENCOUNTER — Telehealth: Payer: Self-pay

## 2018-09-29 NOTE — Telephone Encounter (Signed)
LVM for patient on new RX and instructions for taking it.  Message from Dr. Erin Hearing is also available on My Chart.  Nicholas Houston, [place]

## 2018-10-12 ENCOUNTER — Other Ambulatory Visit: Payer: Self-pay | Admitting: Family Medicine

## 2018-10-12 ENCOUNTER — Encounter: Payer: Self-pay | Admitting: Family Medicine

## 2018-10-12 DIAGNOSIS — I10 Essential (primary) hypertension: Secondary | ICD-10-CM

## 2018-10-12 NOTE — Progress Notes (Signed)
Subjective  Nicholas Houston is a 68 y.o. male is presenting with the following   Chief Complaint noted Review of Symptoms - see HPI PMH - Smoking status noted.    Objective Vital Signs reviewed There were no vitals taken for this visit.  Assessments/Plans  No problem-specific Assessment & Plan notes found for this encounter.   See after visit summary for details of patient instructions

## 2018-10-18 MED ORDER — LOSARTAN POTASSIUM 100 MG PO TABS: 100.0000 mg | ORAL_TABLET | Freq: Every day | ORAL | 1 refills | Status: DC

## 2018-10-18 MED FILL — LOSARTAN POTASSIUM 100 MG T: 100 | 90 days supply | Qty: 90 | Fill #0

## 2018-10-28 ENCOUNTER — Encounter: Payer: Self-pay | Admitting: Family Medicine

## 2018-10-31 NOTE — Telephone Encounter (Signed)
Please call him and make him an appointment to see me when he can  Thanks  [place]

## 2018-11-01 NOTE — Telephone Encounter (Signed)
Attempted to call pt to schedule an appt, went to VM. Left a message for him to call back and schedule. Nicholas Houston Holter, CMA

## 2018-11-02 ENCOUNTER — Other Ambulatory Visit: Payer: Self-pay

## 2018-11-02 ENCOUNTER — Ambulatory Visit (INDEPENDENT_AMBULATORY_CARE_PROVIDER_SITE_OTHER): Payer: 59 | Admitting: Family Medicine

## 2018-11-02 ENCOUNTER — Encounter: Payer: Self-pay | Admitting: Family Medicine

## 2018-11-02 DIAGNOSIS — I10 Essential (primary) hypertension: Secondary | ICD-10-CM | POA: Diagnosis not present

## 2018-11-02 DIAGNOSIS — R3911 Hesitancy of micturition: Secondary | ICD-10-CM | POA: Diagnosis not present

## 2018-11-02 DIAGNOSIS — N401 Enlarged prostate with lower urinary tract symptoms: Secondary | ICD-10-CM

## 2018-11-02 MED ORDER — LOSARTAN POTASSIUM-HCTZ 100-12.5 MG PO TABS: 1.0000 | ORAL_TABLET | Freq: Every day | ORAL | 3 refills | Status: DC

## 2018-11-02 MED ORDER — TAMSULOSIN HCL 0.4 MG PO CAPS: 0.4000 mg | ORAL_CAPSULE | Freq: Every day | ORAL | 3 refills | Status: DC

## 2018-11-02 MED FILL — LOSARTAN-HCTZ 100-12.5 MG T: 100-12.5 | 90 days supply | Qty: 90 | Fill #0

## 2018-11-02 MED FILL — TAMSULOSIN HCL 0.4 MG CAP: 0.4 | 30 days supply | Qty: 30 | Fill #0

## 2018-11-02 NOTE — Patient Instructions (Addendum)
Good to see you today!  Thanks for coming in.  For the blood pressure stop the losartan and start Losartan-hctz once every evening  For the prostate - take the tamsulosin 0.4 mg every evening  Send me your blood pressure readings in a week  You need to get a blood test in one week to check on your potassium  Send in the cologaurd

## 2018-11-02 NOTE — Progress Notes (Signed)
Subjective  Nicholas Houston is a 68 y.o. male is presenting with the following  HYPERTENSION Disease Monitoring Home BP Monitoring (Severity) 160-70s/mid 90s Symptoms - Chest pain- no    Dyspnea- no Medications   Compliance-  Daily losartan . Lightheadedness-  no  Edema- occsl then takes his wife's fluid pill every several weeks and resolves Timing - continuous  Duration - years   BPH Having hesitancy when urinates especially at night.  Was on flomax for short time after urology surgery and this helped.  Has his prostate checked then.  No bleeding or dysuria or frequency   Chief Complaint noted Review of Symptoms - see HPI PMH - Smoking status noted.    Objective Vital Signs reviewed BP (!) 160/92   Pulse 75   Wt 172 lb (78 kg)   SpO2 97%   BMI 23.99 kg/m  Heart - Regular rate and rhythm.  No murmurs, gallops or rubs.    Extremities:  No cyanosis, edema, or deformity noted with good range of motion of all major joints.    Assessments/Plans  BPH (benign prostatic hyperplasia) Not controlled Added tamsulosin.    Hypertension Not at goal.  Add hctz to losartan    See after visit summary for details of patient instructions

## 2018-11-02 NOTE — Assessment & Plan Note (Signed)
Not at goal.  Add hctz to losartan

## 2018-11-02 NOTE — Assessment & Plan Note (Signed)
Not controlled Added tamsulosin.

## 2018-11-09 ENCOUNTER — Other Ambulatory Visit: Payer: 59

## 2018-11-09 ENCOUNTER — Other Ambulatory Visit: Payer: Self-pay

## 2018-11-09 DIAGNOSIS — I10 Essential (primary) hypertension: Secondary | ICD-10-CM | POA: Diagnosis not present

## 2018-11-10 ENCOUNTER — Encounter: Payer: Self-pay | Admitting: Family Medicine

## 2018-11-10 LAB — BASIC METABOLIC PANEL
BUN/Creatinine Ratio: 11 (ref 10–24)
BUN: 12 mg/dL (ref 8–27)
CO2: 25 mmol/L (ref 20–29)
Calcium: 9.1 mg/dL (ref 8.6–10.2)
Chloride: 98 mmol/L (ref 96–106)
Creatinine, Ser: 1.14 mg/dL (ref 0.76–1.27)
GFR calc Af Amer: 75 mL/min/{1.73_m2} (ref >59)
GFR calc non Af Amer: 65 mL/min/{1.73_m2} (ref >59)
Glucose: 82 mg/dL (ref 65–99)
Potassium: 3.9 mmol/L (ref 3.5–5.2)
Sodium: 137 mmol/L (ref 134–144)

## 2018-11-11 DIAGNOSIS — Z1212 Encounter for screening for malignant neoplasm of rectum: Secondary | ICD-10-CM | POA: Diagnosis not present

## 2018-11-11 DIAGNOSIS — Z1211 Encounter for screening for malignant neoplasm of colon: Secondary | ICD-10-CM | POA: Diagnosis not present

## 2018-11-11 LAB — COLOGUARD: Cologuard: NEGATIVE

## 2018-11-21 ENCOUNTER — Encounter: Payer: Self-pay | Admitting: Family Medicine

## 2018-11-23 ENCOUNTER — Other Ambulatory Visit: Payer: Self-pay | Admitting: *Deleted

## 2018-11-23 DIAGNOSIS — Z1211 Encounter for screening for malignant neoplasm of colon: Secondary | ICD-10-CM

## 2018-12-01 DIAGNOSIS — H52223 Regular astigmatism, bilateral: Secondary | ICD-10-CM | POA: Diagnosis not present

## 2018-12-01 DIAGNOSIS — H5203 Hypermetropia, bilateral: Secondary | ICD-10-CM | POA: Diagnosis not present

## 2018-12-01 MED FILL — TAMSULOSIN HCL 0.4 MG CAP: 0.4 | 30 days supply | Qty: 30 | Fill #1

## 2019-01-02 MED FILL — TAMSULOSIN HCL 0.4 MG CAP: 0.4 | 30 days supply | Qty: 30 | Fill #2

## 2019-01-18 ENCOUNTER — Encounter: Payer: Self-pay | Admitting: Family Medicine

## 2019-01-18 ENCOUNTER — Ambulatory Visit (INDEPENDENT_AMBULATORY_CARE_PROVIDER_SITE_OTHER): Payer: 59 | Admitting: Family Medicine

## 2019-01-18 ENCOUNTER — Other Ambulatory Visit: Payer: Self-pay

## 2019-01-18 DIAGNOSIS — M545 Low back pain, unspecified: Secondary | ICD-10-CM

## 2019-01-18 DIAGNOSIS — M5432 Sciatica, left side: Secondary | ICD-10-CM | POA: Insufficient documentation

## 2019-01-18 DIAGNOSIS — M25562 Pain in left knee: Secondary | ICD-10-CM | POA: Diagnosis not present

## 2019-01-18 MED ORDER — TIZANIDINE HCL 2 MG PO CAPS: 2.0000 mg | ORAL_CAPSULE | Freq: Two times a day (BID) | ORAL | 0 refills | Status: DC

## 2019-01-18 MED FILL — tiZANidine HCL 2 MG TABS: 2 | 10 days supply | Qty: 20 | Fill #0

## 2019-01-18 NOTE — Progress Notes (Signed)
Subjective:  Nicholas Houston is a 68 y.o. male who presents to the [facility] today with a chief complaint of hip/low back pain.   HPI:  Back pain Mr. Aldridge notes that he has been having some lower back pain and left leg pain for roughly the past week.  His pain is primarily dull/achy pain with occasional cramping of his left thigh and calf.  He does not remember any significant trauma.  His job does entail a lot of physical labor but he has not had any significant changes in the type of work he has been doing recently.  At home, he has been taking Aleve twice daily and been applying heat with no significant improvement.  He reports that he had one episode of significant muscle cramping 4 days ago.  Knee pain Reports that he had some mild knee pain earlier this week followed by significant knee pop this past Saturday.  Since then, he is actually felt some relief in his knee but he is kept his knee brace on.  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  He denies any locking or giving out of his knee.  He notes that his knee does Creek sometimes.   Chief Complaint noted Review of Symptoms - see HPI PMH -generalized osteoarthritis   Objective:  Physical Exam: BP 126/80   Pulse (!) 103   Wt 153 lb 12.8 oz (69.8 kg)   SpO2 98%   BMI 21.45 kg/m    Low back/hip: No tenderness with percussion of the spinous processes.  Mild tenderness over the paraspinal muscles in the lumbar region, more on the left side compared to right.  No tenderness with palpation of the muscle bellies over the gluteal region, quadriceps, hamstring, gastroc.  Good range of motion of the lumbar spine with axial rotation, forward flexion and backward extension.  Negative straight leg raise.  Negative FADIR and FABER on the left.  Some pain elicited in the inner groin with FABER of the right hip.  Neurovascularly intact.  Left knee: No  bony abnormalities.  No evidence of laxity.  5/5 strength with flexion and extension.  Normal patellar reflexes.  Negative anterior drawer sign.  Negative Thessaly's.  No results found for this or any previous visit (from the past 72 hour(s)).   Assessment/Plan:  Low back pain Likely soft tissue injury/muscle strain.  Low suspicion for herniated disc, compression fracture, sciatica.  Likely some component of underlying osteoarthritis of the hip based on some pain of the internal right hip with FABER.  However, this OA does not seem to be his main complaint at this time.  For now, would recommend heat, NSAIDs and tizanidine at night with stretching exercises. -Return to clinic 3 weeks to assess improvement -Consider lumbar and hip imaging if minimal/no improvement  Knee pain, left Only bothersome the past 5 to 7 days.  Seems to already be significantly improving but he continues to wear his brace to be safe.  Unclear etiology.  Likely soft tissue injury.  No evidence of ligamentous strain on exam.  No clear indication of meniscus injury.  Osteoarthritis certainly on the differential although it appears to be improving in the past couple of days.  NSAIDs and rest for now.  We will follow-up at next appointment and consider imaging if he continues to complain of knee pain.   Vaccine against  pneumonia He thought that he had already discussed this with his wife and was under the impression that he might already have the vaccine. He was encouraged to look for documentation of this vaccine and bring it in next time he comes to clinic.

## 2019-01-18 NOTE — Assessment & Plan Note (Addendum)
Likely soft tissue injury/muscle strain.  Low suspicion for herniated disc, compression fracture, sciatica.  Likely some component of underlying osteoarthritis of the hip based on some pain of the internal right hip with FABER.  However, this OA does not seem to be his main complaint at this time.  For now, would recommend heat, NSAIDs and tizanidine at night with stretching exercises. -Return to clinic 3 weeks to assess improvement -Consider lumbar and hip imaging if minimal/no improvement

## 2019-01-18 NOTE — Patient Instructions (Addendum)
In addition to the exercises below.  I think that the Alieve and heat will be helpful. Let's touch base in 3 weeks and see how you are doing.  This is most likely a muscle strain and will improve with rest and stretching.  Low Back Sprain or Strain Rehab Ask your health care provider which exercises are safe for you. Do exercises exactly as told by your health care provider and adjust them as directed. It is normal to feel mild stretching, pulling, tightness, or discomfort as you do these exercises. Stop right away if you feel sudden pain or your pain gets worse. Do not begin these exercises until told by your health care provider. Stretching and range-of-motion exercises These exercises warm up your muscles and joints and improve the movement and flexibility of your back. These exercises also help to relieve pain, numbness, and tingling. Lumbar rotation  1. Lie on your back on a firm surface and bend your knees. 2. Straighten your arms out to your sides so each arm forms a 90-degree angle (right angle) with a side of your body. 3. Slowly move (rotate) both of your knees to one side of your body until you feel a stretch in your lower back (lumbar). Try not to let your shoulders lift off the floor. 4. Hold this position for __________ seconds. 5. Tense your abdominal muscles and slowly move your knees back to the starting position. 6. Repeat this exercise on the other side of your body. Repeat __________ times. Complete this exercise __________ times a day. Single knee to chest  1. Lie on your back on a firm surface with both legs straight. 2. Bend one of your knees. Use your hands to move your knee up toward your chest until you feel a gentle stretch in your lower back and buttock. ? Hold your leg in this position by holding on to the front of your knee. ? Keep your other leg as straight as possible. 3. Hold this position for __________ seconds. 4. Slowly return to the starting  position. 5. Repeat with your other leg. Repeat __________ times. Complete this exercise __________ times a day. Prone extension on elbows  1. Lie on your abdomen on a firm surface (prone position). 2. Prop yourself up on your elbows. 3. Use your arms to help lift your chest up until you feel a gentle stretch in your abdomen and your lower back. ? This will place some of your body weight on your elbows. If this is uncomfortable, try stacking pillows under your chest. ? Your hips should stay down, against the surface that you are lying on. Keep your hip and back muscles relaxed. 4. Hold this position for __________ seconds. 5. Slowly relax your upper body and return to the starting position. Repeat __________ times. Complete this exercise __________ times a day. Strengthening exercises These exercises build strength and endurance in your back. Endurance is the ability to use your muscles for a long time, even after they get tired. Pelvic tilt This exercise strengthens the muscles that lie deep in the abdomen. 1. Lie on your back on a firm surface. Bend your knees and keep your feet flat on the floor. 2. Tense your abdominal muscles. Tip your pelvis up toward the ceiling and flatten your lower back into the floor. ? To help with this exercise, you may place a small towel under your lower back and try to push your back into the towel. 3. Hold this position for __________ seconds. 4.  Let your muscles relax completely before you repeat this exercise. Repeat __________ times. Complete this exercise __________ times a day. Alternating arm and leg raises  1. Get on your hands and knees on a firm surface. If you are on a hard floor, you may want to use padding, such as an exercise mat, to cushion your knees. 2. Line up your arms and legs. Your hands should be directly below your shoulders, and your knees should be directly below your hips. 3. Lift your left leg behind you. At the same time, raise  your right arm and straighten it in front of you. ? Do not lift your leg higher than your hip. ? Do not lift your arm higher than your shoulder. ? Keep your abdominal and back muscles tight. ? Keep your hips facing the ground. ? Do not arch your back. ? Keep your balance carefully, and do not hold your breath. 4. Hold this position for __________ seconds. 5. Slowly return to the starting position. 6. Repeat with your right leg and your left arm. Repeat __________ times. Complete this exercise __________ times a day. Abdominal set with straight leg raise  1. Lie on your back on a firm surface. 2. Bend one of your knees and keep your other leg straight. 3. Tense your abdominal muscles and lift your straight leg up, 4-6 inches (10-15 cm) off the ground. 4. Keep your abdominal muscles tight and hold this position for __________ seconds. ? Do not hold your breath. ? Do not arch your back. Keep it flat against the ground. 5. Keep your abdominal muscles tense as you slowly lower your leg back to the starting position. 6. Repeat with your other leg. Repeat __________ times. Complete this exercise __________ times a day. Single leg lower with bent knees 1. Lie on your back on a firm surface. 2. Tense your abdominal muscles and lift your feet off the floor, one foot at a time, so your knees and hips are bent in 90-degree angles (right angles). ? Your knees should be over your hips and your lower legs should be parallel to the floor. 3. Keeping your abdominal muscles tense and your knee bent, slowly lower one of your legs so your toe touches the ground. 4. Lift your leg back up to return to the starting position. ? Do not hold your breath. ? Do not let your back arch. Keep your back flat against the ground. 5. Repeat with your other leg. Repeat __________ times. Complete this exercise __________ times a day. Posture and body mechanics Good posture and healthy body mechanics can help to relieve  stress in your body's tissues and joints. Body mechanics refers to the movements and positions of your body while you do your daily activities. Posture is part of body mechanics. Good posture means:  Your spine is in its natural S-curve position (neutral).  Your shoulders are pulled back slightly.  Your head is not tipped forward. Follow these guidelines to improve your posture and body mechanics in your everyday activities. Standing   When standing, keep your spine neutral and your feet about hip width apart. Keep a slight bend in your knees. Your ears, shoulders, and hips should line up.  When you do a task in which you stand in one place for a long time, place one foot up on a stable object that is 2-4 inches (5-10 cm) high, such as a footstool. This helps keep your spine neutral. Sitting   When sitting, keep your  spine neutral and keep your feet flat on the floor. Use a footrest, if necessary, and keep your thighs parallel to the floor. Avoid rounding your shoulders, and avoid tilting your head forward.  When working at a desk or a computer, keep your desk at a height where your hands are slightly lower than your elbows. Slide your chair under your desk so you are close enough to maintain good posture.  When working at a computer, place your monitor at a height where you are looking straight ahead and you do not have to tilt your head forward or downward to look at the screen. Resting  When lying down and resting, avoid positions that are most painful for you.  If you have pain with activities such as sitting, bending, stooping, or squatting, lie in a position in which your body does not bend very much. For example, avoid curling up on your side with your arms and knees near your chest (fetal position).  If you have pain with activities such as standing for a long time or reaching with your arms, lie with your spine in a neutral position and bend your knees slightly. Try the following  positions: ? Lying on your side with a pillow between your knees. ? Lying on your back with a pillow under your knees. Lifting   When lifting objects, keep your feet at least shoulder width apart and tighten your abdominal muscles.  Bend your knees and hips and keep your spine neutral. It is important to lift using the strength of your legs, not your back. Do not lock your knees straight out.  Always ask for help to lift heavy or awkward objects. This information is not intended to replace advice given to you by your health care provider. Make sure you discuss any questions you have with your health care provider. Document Released: 01/12/2005 Document Revised: 05/06/2018 Document Reviewed: 02/03/2018 Elsevier Patient Education  2020 [employer].

## 2019-01-18 NOTE — Assessment & Plan Note (Signed)
Only bothersome the past 5 to 7 days.  Seems to already be significantly improving but he continues to wear his brace to be safe.  Unclear etiology.  Likely soft tissue injury.  No evidence of ligamentous strain on exam.  No clear indication of meniscus injury.  Osteoarthritis certainly on the differential although it appears to be improving in the past couple of days.  NSAIDs and rest for now.  We will follow-up at next appointment and consider imaging if he continues to complain of knee pain.

## 2019-01-22 ENCOUNTER — Other Ambulatory Visit: Payer: Self-pay | Admitting: Family Medicine

## 2019-01-22 DIAGNOSIS — M545 Low back pain, unspecified: Secondary | ICD-10-CM

## 2019-01-22 DIAGNOSIS — M25562 Pain in left knee: Secondary | ICD-10-CM

## 2019-01-22 MED ORDER — DICLOFENAC SODIUM 1 % EX GEL: 2.0000 g | Freq: Four times a day (QID) | CUTANEOUS | 0 refills | Status: DC

## 2019-01-22 NOTE — Progress Notes (Signed)
Mr. Nicholas Houston called the after-hours line due to continued back and knee pain.  He reports that he continues to have similar left hip/low back pain and worsening left knee pain.  He has been doing stretches at home in addition to taking Aleve and tizanidine.  These interventions have not been very helpful and he thinks his left knee feels a little worse and has a lower back feels about the same.  He continues to have significant muscle spasms at night that keep him awake.  We discussed that this may still be from a soft tissue injury and a strain of his lower back.  Because of his worsening symptoms, we will move forward with x-rays and formal physical therapy.  He was encouraged to increase his tizanidine to 4 mg twice daily. -X-ray lumbar spine placed -X-ray left knee placed -Ambulatory referral to physical therapy -Voltaren gel ordered -informed to increase tizanidine

## 2019-01-23 ENCOUNTER — Other Ambulatory Visit: Payer: Self-pay

## 2019-01-23 ENCOUNTER — Other Ambulatory Visit: Payer: Self-pay | Admitting: Family Medicine

## 2019-01-23 ENCOUNTER — Ambulatory Visit
Admission: RE | Admit: 2019-01-23 | Discharge: 2019-01-23 | Disposition: A | Discharge status: Home / Self Care | Payer: 59 | Source: Ambulatory Visit | Attending: Family Medicine | Admitting: Family Medicine

## 2019-01-23 DIAGNOSIS — M48061 Spinal stenosis, lumbar region without neurogenic claudication: Secondary | ICD-10-CM | POA: Diagnosis not present

## 2019-01-23 DIAGNOSIS — M545 Low back pain, unspecified: Secondary | ICD-10-CM

## 2019-01-23 DIAGNOSIS — M25562 Pain in left knee: Secondary | ICD-10-CM

## 2019-01-23 DIAGNOSIS — M47816 Spondylosis without myelopathy or radiculopathy, lumbar region: Secondary | ICD-10-CM | POA: Diagnosis not present

## 2019-01-24 ENCOUNTER — Telehealth: Payer: Self-pay

## 2019-01-24 ENCOUNTER — Encounter: Payer: Self-pay | Admitting: Family Medicine

## 2019-01-24 NOTE — Telephone Encounter (Signed)
Sent patient a MY Chart message.

## 2019-01-24 NOTE — Telephone Encounter (Signed)
Pt calls nurse line requesting x-ray results.  Talbot Grumbling, RN

## 2019-01-25 ENCOUNTER — Other Ambulatory Visit: Payer: Self-pay

## 2019-01-25 ENCOUNTER — Telehealth: Payer: Self-pay | Admitting: Family Medicine

## 2019-01-25 MED ORDER — TIZANIDINE HCL 2 MG PO CAPS: 2.0000 mg | ORAL_CAPSULE | Freq: Two times a day (BID) | ORAL | 0 refills | Status: DC

## 2019-01-25 NOTE — Telephone Encounter (Signed)
Still having pain down L leg.  No weakness or incontinence  Suggested taking 3 Aleve twice a day with Tylenol three times a day and Zanaflex.  Has not worked since before [employer] he call if not better by next week and if he has any worsening especially weakness    He agrees

## 2019-01-25 NOTE — Telephone Encounter (Signed)
Patient came by he needs refill on RX Tizanidine HCL 2 MG.  Needs refill totally out of medicine.  He last saw Dr. Pilar Plate.  Need to call patient would like to speak with you  Regarding results of Xray and prognosis.  Rehab referral has questions please call patient.  [phone].Nicholas Houston

## 2019-01-26 MED FILL — tiZANidine HCL 2 MG TABS: 2 | 15 days supply | Qty: 30 | Fill #0

## 2019-01-31 DIAGNOSIS — M9903 Segmental and somatic dysfunction of lumbar region: Secondary | ICD-10-CM | POA: Diagnosis not present

## 2019-01-31 DIAGNOSIS — R203 Hyperesthesia: Secondary | ICD-10-CM | POA: Diagnosis not present

## 2019-01-31 DIAGNOSIS — M9906 Segmental and somatic dysfunction of lower extremity: Secondary | ICD-10-CM | POA: Diagnosis not present

## 2019-01-31 DIAGNOSIS — M5442 Lumbago with sciatica, left side: Secondary | ICD-10-CM | POA: Diagnosis not present

## 2019-02-01 DIAGNOSIS — M9903 Segmental and somatic dysfunction of lumbar region: Secondary | ICD-10-CM | POA: Diagnosis not present

## 2019-02-01 DIAGNOSIS — R203 Hyperesthesia: Secondary | ICD-10-CM | POA: Diagnosis not present

## 2019-02-01 DIAGNOSIS — M9906 Segmental and somatic dysfunction of lower extremity: Secondary | ICD-10-CM | POA: Diagnosis not present

## 2019-02-01 DIAGNOSIS — M5442 Lumbago with sciatica, left side: Secondary | ICD-10-CM | POA: Diagnosis not present

## 2019-02-03 DIAGNOSIS — M9903 Segmental and somatic dysfunction of lumbar region: Secondary | ICD-10-CM | POA: Diagnosis not present

## 2019-02-03 DIAGNOSIS — M5442 Lumbago with sciatica, left side: Secondary | ICD-10-CM | POA: Diagnosis not present

## 2019-02-03 DIAGNOSIS — M9906 Segmental and somatic dysfunction of lower extremity: Secondary | ICD-10-CM | POA: Diagnosis not present

## 2019-02-03 DIAGNOSIS — R203 Hyperesthesia: Secondary | ICD-10-CM | POA: Diagnosis not present

## 2019-02-06 DIAGNOSIS — R203 Hyperesthesia: Secondary | ICD-10-CM | POA: Diagnosis not present

## 2019-02-06 DIAGNOSIS — M5442 Lumbago with sciatica, left side: Secondary | ICD-10-CM | POA: Diagnosis not present

## 2019-02-06 DIAGNOSIS — M9906 Segmental and somatic dysfunction of lower extremity: Secondary | ICD-10-CM | POA: Diagnosis not present

## 2019-02-06 DIAGNOSIS — M9903 Segmental and somatic dysfunction of lumbar region: Secondary | ICD-10-CM | POA: Diagnosis not present

## 2019-02-07 ENCOUNTER — Other Ambulatory Visit: Payer: Self-pay

## 2019-02-07 ENCOUNTER — Ambulatory Visit (INDEPENDENT_AMBULATORY_CARE_PROVIDER_SITE_OTHER): Payer: 59 | Admitting: Family Medicine

## 2019-02-07 ENCOUNTER — Encounter: Payer: Self-pay | Admitting: Family Medicine

## 2019-02-07 DIAGNOSIS — M545 Low back pain, unspecified: Secondary | ICD-10-CM

## 2019-02-07 DIAGNOSIS — I10 Essential (primary) hypertension: Secondary | ICD-10-CM

## 2019-02-07 MED ORDER — TIZANIDINE HCL 2 MG PO CAPS: 2.0000 mg | ORAL_CAPSULE | Freq: Two times a day (BID) | ORAL | 0 refills | Status: DC

## 2019-02-07 MED FILL — tiZANidine HCL 2 MG TABS: 2 | 15 days supply | Qty: 30 | Fill #0

## 2019-02-07 MED FILL — LOSARTAN-HCTZ 100-12.5 MG T: 100-12.5 | 90 days supply | Qty: 90 | Fill #1

## 2019-02-07 NOTE — Progress Notes (Signed)
Subjective  Nicholas Houston is a 69 y.o. male is presenting with the following  HYPERTENSION Home BP Monitoring readings: his machine usually reads in 150s/90s  Chest pain- no      Medications   Compliance-  Takes daily. Lightheadedness-  no  Edema- no  BACK PAIN Slowly improving with chiropractic manipulation and exercises.  Back at work but not strenous.  Taking zanaflex and NSAID/tylenol regularly.  No weakness or incontinence but pain tingling down L leg   Objective Vital Signs reviewed BP 140/80   Pulse 88   Wt 158 lb 3.2 oz (71.8 kg)   SpO2 99%   BMI 22.06 kg/m  Able to stand on heels and toes Able to do deep knee bend partially but has pain Tender over bilateral lumbar paraspinous muscles not midline  Assessments/Plans  Hypertension At goal in office readings but not at home.  Asked him to bring in his meter for checking   Low back pain Slow improvement.  Suggestive of disc herniation.  No evidence of infection or cancer or fracture    See after visit summary for details of patient instructions

## 2019-02-07 NOTE — Assessment & Plan Note (Signed)
At goal in office readings but not at home.  Asked him to bring in his meter for checking

## 2019-02-07 NOTE — Assessment & Plan Note (Signed)
Slow improvement.  Suggestive of disc herniation.  No evidence of infection or cancer or fracture

## 2019-02-07 NOTE — Patient Instructions (Addendum)
Good to see you today!  Thanks for coming in.  Keep exercising  Try to cut back on the muscle relaxer taking once a day and then only as needed   Continue the ibuprofen and tylenol   If any worsening then let me know  Check your blood pressure at a drug store or you can bring your cuff and check it here  Send me your reading on My Chart

## 2019-02-08 ENCOUNTER — Other Ambulatory Visit: Payer: Self-pay

## 2019-02-08 ENCOUNTER — Ambulatory Visit: Payer: 59

## 2019-02-08 DIAGNOSIS — I10 Essential (primary) hypertension: Secondary | ICD-10-CM

## 2019-02-08 MED FILL — TAMSULOSIN HCL 0.4 MG CAP: 0.4 | 30 days supply | Qty: 30 | Fill #3

## 2019-02-08 NOTE — Progress Notes (Signed)
Patient presents in nurse clinic to compare blood pressure readings with his home cuff. Patients home cuff was placed on right arm and I checked on his left. His reading 185/109, mine 155/90. I let the patient sit for 15 minutes and we rechecked switching arms. His reading 210/115, mine 152/80. I advised patient he should look into getting a new blood pressure monitor, as his is off. Will forward to pcp.

## 2019-02-09 DIAGNOSIS — M9903 Segmental and somatic dysfunction of lumbar region: Secondary | ICD-10-CM | POA: Diagnosis not present

## 2019-02-09 DIAGNOSIS — M5442 Lumbago with sciatica, left side: Secondary | ICD-10-CM | POA: Diagnosis not present

## 2019-02-09 DIAGNOSIS — R203 Hyperesthesia: Secondary | ICD-10-CM | POA: Diagnosis not present

## 2019-02-09 DIAGNOSIS — M9906 Segmental and somatic dysfunction of lower extremity: Secondary | ICD-10-CM | POA: Diagnosis not present

## 2019-02-13 DIAGNOSIS — M5442 Lumbago with sciatica, left side: Secondary | ICD-10-CM | POA: Diagnosis not present

## 2019-02-13 DIAGNOSIS — M9903 Segmental and somatic dysfunction of lumbar region: Secondary | ICD-10-CM | POA: Diagnosis not present

## 2019-02-13 DIAGNOSIS — R203 Hyperesthesia: Secondary | ICD-10-CM | POA: Diagnosis not present

## 2019-02-13 DIAGNOSIS — M9906 Segmental and somatic dysfunction of lower extremity: Secondary | ICD-10-CM | POA: Diagnosis not present

## 2019-02-17 DIAGNOSIS — R203 Hyperesthesia: Secondary | ICD-10-CM | POA: Diagnosis not present

## 2019-02-17 DIAGNOSIS — M5442 Lumbago with sciatica, left side: Secondary | ICD-10-CM | POA: Diagnosis not present

## 2019-02-17 DIAGNOSIS — M9903 Segmental and somatic dysfunction of lumbar region: Secondary | ICD-10-CM | POA: Diagnosis not present

## 2019-02-17 DIAGNOSIS — M9906 Segmental and somatic dysfunction of lower extremity: Secondary | ICD-10-CM | POA: Diagnosis not present

## 2019-02-21 DIAGNOSIS — M9903 Segmental and somatic dysfunction of lumbar region: Secondary | ICD-10-CM | POA: Diagnosis not present

## 2019-02-21 DIAGNOSIS — R203 Hyperesthesia: Secondary | ICD-10-CM | POA: Diagnosis not present

## 2019-02-21 DIAGNOSIS — M5442 Lumbago with sciatica, left side: Secondary | ICD-10-CM | POA: Diagnosis not present

## 2019-02-21 DIAGNOSIS — M9906 Segmental and somatic dysfunction of lower extremity: Secondary | ICD-10-CM | POA: Diagnosis not present

## 2019-02-24 DIAGNOSIS — M9903 Segmental and somatic dysfunction of lumbar region: Secondary | ICD-10-CM | POA: Diagnosis not present

## 2019-02-24 DIAGNOSIS — M5442 Lumbago with sciatica, left side: Secondary | ICD-10-CM | POA: Diagnosis not present

## 2019-02-24 DIAGNOSIS — M9906 Segmental and somatic dysfunction of lower extremity: Secondary | ICD-10-CM | POA: Diagnosis not present

## 2019-02-24 DIAGNOSIS — R203 Hyperesthesia: Secondary | ICD-10-CM | POA: Diagnosis not present

## 2019-02-27 DIAGNOSIS — M5442 Lumbago with sciatica, left side: Secondary | ICD-10-CM | POA: Diagnosis not present

## 2019-02-27 DIAGNOSIS — M9903 Segmental and somatic dysfunction of lumbar region: Secondary | ICD-10-CM | POA: Diagnosis not present

## 2019-02-27 DIAGNOSIS — M9906 Segmental and somatic dysfunction of lower extremity: Secondary | ICD-10-CM | POA: Diagnosis not present

## 2019-02-27 DIAGNOSIS — R203 Hyperesthesia: Secondary | ICD-10-CM | POA: Diagnosis not present

## 2019-03-02 DIAGNOSIS — M5442 Lumbago with sciatica, left side: Secondary | ICD-10-CM | POA: Diagnosis not present

## 2019-03-02 DIAGNOSIS — M9906 Segmental and somatic dysfunction of lower extremity: Secondary | ICD-10-CM | POA: Diagnosis not present

## 2019-03-02 DIAGNOSIS — R203 Hyperesthesia: Secondary | ICD-10-CM | POA: Diagnosis not present

## 2019-03-02 DIAGNOSIS — M9903 Segmental and somatic dysfunction of lumbar region: Secondary | ICD-10-CM | POA: Diagnosis not present

## 2019-03-06 DIAGNOSIS — R203 Hyperesthesia: Secondary | ICD-10-CM | POA: Diagnosis not present

## 2019-03-06 DIAGNOSIS — M5442 Lumbago with sciatica, left side: Secondary | ICD-10-CM | POA: Diagnosis not present

## 2019-03-06 DIAGNOSIS — M9906 Segmental and somatic dysfunction of lower extremity: Secondary | ICD-10-CM | POA: Diagnosis not present

## 2019-03-06 DIAGNOSIS — M9903 Segmental and somatic dysfunction of lumbar region: Secondary | ICD-10-CM | POA: Diagnosis not present

## 2019-03-09 DIAGNOSIS — M5442 Lumbago with sciatica, left side: Secondary | ICD-10-CM | POA: Diagnosis not present

## 2019-03-09 DIAGNOSIS — R203 Hyperesthesia: Secondary | ICD-10-CM | POA: Diagnosis not present

## 2019-03-09 DIAGNOSIS — M9906 Segmental and somatic dysfunction of lower extremity: Secondary | ICD-10-CM | POA: Diagnosis not present

## 2019-03-09 DIAGNOSIS — M9903 Segmental and somatic dysfunction of lumbar region: Secondary | ICD-10-CM | POA: Diagnosis not present

## 2019-03-14 DIAGNOSIS — M9902 Segmental and somatic dysfunction of thoracic region: Secondary | ICD-10-CM | POA: Diagnosis not present

## 2019-03-14 DIAGNOSIS — M9903 Segmental and somatic dysfunction of lumbar region: Secondary | ICD-10-CM | POA: Diagnosis not present

## 2019-03-14 DIAGNOSIS — M5442 Lumbago with sciatica, left side: Secondary | ICD-10-CM | POA: Diagnosis not present

## 2019-03-14 DIAGNOSIS — R203 Hyperesthesia: Secondary | ICD-10-CM | POA: Diagnosis not present

## 2019-03-17 ENCOUNTER — Other Ambulatory Visit: Payer: Self-pay | Admitting: Family Medicine

## 2019-03-17 MED FILL — TAMSULOSIN HCL 0.4 MG CAP: 0.4 | 90 days supply | Qty: 90 | Fill #0

## 2019-03-21 DIAGNOSIS — R203 Hyperesthesia: Secondary | ICD-10-CM | POA: Diagnosis not present

## 2019-03-21 DIAGNOSIS — M5442 Lumbago with sciatica, left side: Secondary | ICD-10-CM | POA: Diagnosis not present

## 2019-03-21 DIAGNOSIS — M9903 Segmental and somatic dysfunction of lumbar region: Secondary | ICD-10-CM | POA: Diagnosis not present

## 2019-03-21 DIAGNOSIS — M9902 Segmental and somatic dysfunction of thoracic region: Secondary | ICD-10-CM | POA: Diagnosis not present

## 2019-03-23 DIAGNOSIS — R203 Hyperesthesia: Secondary | ICD-10-CM | POA: Diagnosis not present

## 2019-03-23 DIAGNOSIS — M9903 Segmental and somatic dysfunction of lumbar region: Secondary | ICD-10-CM | POA: Diagnosis not present

## 2019-03-23 DIAGNOSIS — M9902 Segmental and somatic dysfunction of thoracic region: Secondary | ICD-10-CM | POA: Diagnosis not present

## 2019-03-23 DIAGNOSIS — M5442 Lumbago with sciatica, left side: Secondary | ICD-10-CM | POA: Diagnosis not present

## 2019-03-28 DIAGNOSIS — M9903 Segmental and somatic dysfunction of lumbar region: Secondary | ICD-10-CM | POA: Diagnosis not present

## 2019-03-28 DIAGNOSIS — R203 Hyperesthesia: Secondary | ICD-10-CM | POA: Diagnosis not present

## 2019-03-28 DIAGNOSIS — M9902 Segmental and somatic dysfunction of thoracic region: Secondary | ICD-10-CM | POA: Diagnosis not present

## 2019-03-28 DIAGNOSIS — M5442 Lumbago with sciatica, left side: Secondary | ICD-10-CM | POA: Diagnosis not present

## 2019-04-11 DIAGNOSIS — M9903 Segmental and somatic dysfunction of lumbar region: Secondary | ICD-10-CM | POA: Diagnosis not present

## 2019-04-11 DIAGNOSIS — R203 Hyperesthesia: Secondary | ICD-10-CM | POA: Diagnosis not present

## 2019-04-11 DIAGNOSIS — M5442 Lumbago with sciatica, left side: Secondary | ICD-10-CM | POA: Diagnosis not present

## 2019-04-11 DIAGNOSIS — M9902 Segmental and somatic dysfunction of thoracic region: Secondary | ICD-10-CM | POA: Diagnosis not present

## 2019-04-18 DIAGNOSIS — R203 Hyperesthesia: Secondary | ICD-10-CM | POA: Diagnosis not present

## 2019-04-18 DIAGNOSIS — M5442 Lumbago with sciatica, left side: Secondary | ICD-10-CM | POA: Diagnosis not present

## 2019-04-18 DIAGNOSIS — M9902 Segmental and somatic dysfunction of thoracic region: Secondary | ICD-10-CM | POA: Diagnosis not present

## 2019-04-18 DIAGNOSIS — M9903 Segmental and somatic dysfunction of lumbar region: Secondary | ICD-10-CM | POA: Diagnosis not present

## 2019-04-21 DIAGNOSIS — M9903 Segmental and somatic dysfunction of lumbar region: Secondary | ICD-10-CM | POA: Diagnosis not present

## 2019-04-21 DIAGNOSIS — M5442 Lumbago with sciatica, left side: Secondary | ICD-10-CM | POA: Diagnosis not present

## 2019-04-21 DIAGNOSIS — R203 Hyperesthesia: Secondary | ICD-10-CM | POA: Diagnosis not present

## 2019-04-21 DIAGNOSIS — M9902 Segmental and somatic dysfunction of thoracic region: Secondary | ICD-10-CM | POA: Diagnosis not present

## 2019-04-26 DIAGNOSIS — R203 Hyperesthesia: Secondary | ICD-10-CM | POA: Diagnosis not present

## 2019-04-26 DIAGNOSIS — M9903 Segmental and somatic dysfunction of lumbar region: Secondary | ICD-10-CM | POA: Diagnosis not present

## 2019-04-26 DIAGNOSIS — M9902 Segmental and somatic dysfunction of thoracic region: Secondary | ICD-10-CM | POA: Diagnosis not present

## 2019-04-26 DIAGNOSIS — M5442 Lumbago with sciatica, left side: Secondary | ICD-10-CM | POA: Diagnosis not present

## 2019-05-03 DIAGNOSIS — M5442 Lumbago with sciatica, left side: Secondary | ICD-10-CM | POA: Diagnosis not present

## 2019-05-03 DIAGNOSIS — R203 Hyperesthesia: Secondary | ICD-10-CM | POA: Diagnosis not present

## 2019-05-03 DIAGNOSIS — M9903 Segmental and somatic dysfunction of lumbar region: Secondary | ICD-10-CM | POA: Diagnosis not present

## 2019-05-03 DIAGNOSIS — M9902 Segmental and somatic dysfunction of thoracic region: Secondary | ICD-10-CM | POA: Diagnosis not present

## 2019-05-10 DIAGNOSIS — R203 Hyperesthesia: Secondary | ICD-10-CM | POA: Diagnosis not present

## 2019-05-10 DIAGNOSIS — M9903 Segmental and somatic dysfunction of lumbar region: Secondary | ICD-10-CM | POA: Diagnosis not present

## 2019-05-10 DIAGNOSIS — M5442 Lumbago with sciatica, left side: Secondary | ICD-10-CM | POA: Diagnosis not present

## 2019-05-10 DIAGNOSIS — M9902 Segmental and somatic dysfunction of thoracic region: Secondary | ICD-10-CM | POA: Diagnosis not present

## 2019-05-11 MED FILL — LOSARTAN-HCTZ 100-12.5 MG T: 100-12.5 | 90 days supply | Qty: 90 | Fill #2

## 2019-05-17 ENCOUNTER — Ambulatory Visit (INDEPENDENT_AMBULATORY_CARE_PROVIDER_SITE_OTHER): Payer: 59 | Admitting: Family Medicine

## 2019-05-17 ENCOUNTER — Telehealth: Payer: Self-pay

## 2019-05-17 ENCOUNTER — Other Ambulatory Visit: Payer: Self-pay

## 2019-05-17 DIAGNOSIS — M5432 Sciatica, left side: Secondary | ICD-10-CM | POA: Diagnosis not present

## 2019-05-17 MED ORDER — GABAPENTIN 100 MG PO CAPS: 100.0000 mg | ORAL_CAPSULE | Freq: Three times a day (TID) | ORAL | 3 refills | Status: DC

## 2019-05-17 MED FILL — GABAPENTIN 100 MG CAPSULE: 100 | 30 days supply | Qty: 90 | Fill #0

## 2019-05-17 NOTE — Telephone Encounter (Signed)
Called patient with appointment for:   MRI Lumbar w/o contrast 05/31/2019 1600 showtime 1530 @ [facility]  .Glennie Hawk, CMA

## 2019-05-17 NOTE — Progress Notes (Signed)
    SUBJECTIVE:   CHIEF COMPLAINT / HPI:   L SIDED BACK LEG PAIN  Since around Xmas.  Radiates down L leg from lower back.  Had LS spine films and has been seeing chiropractor.  Was improving until March but since has worsened.  Trouble sleeping and moving around.  Taking otc pain medications.  No incontinence or weakness or weight loss  PERTINENT  PMH / PSH: HYPERTENSION BPH  OBJECTIVE:   BP 140/80   Pulse 86   Ht 5\' 11"  (1.803 m)   Wt 160 lb 6.4 oz (72.8 kg)   SpO2 98%   BMI 22.37 kg/m   Mild distress Mobility:able to get up and down from exam table without assistance and mild distress Able to stand on heels and toes but with pain  Back - Normal skin, Spine with normal alignment and no deformity.  No tenderness to vertebral process palpation.   Negative SLR   ASSESSMENT/PLAN:   Left sciatic nerve pain Worsened.  Given no improvement in 4 mo will get MRI looking for herniated disc.  No red flags for cancer.  Also start neurontin for pain. See after visit summary      , MD [facility]

## 2019-05-17 NOTE — Assessment & Plan Note (Signed)
Worsened.  Given no improvement in 4 mo will get MRI looking for herniated disc.  No red flags for cancer.  Also start neurontin for pain. See after visit summary

## 2019-05-17 NOTE — Patient Instructions (Signed)
Good to see you today!  Thanks for coming in.  We will get an MRI to look if anything needs surgery  Try gabapentin 100 mg three times a day.  If it makes you too sleepy then take only at night You can increase up to 3 tabs three times a day as needed  If you have any sudden weakness or trouble controlling your bladder then call us  I will be in touch about the MRI results - I will call

## 2019-05-24 DIAGNOSIS — M9903 Segmental and somatic dysfunction of lumbar region: Secondary | ICD-10-CM | POA: Diagnosis not present

## 2019-05-24 DIAGNOSIS — R203 Hyperesthesia: Secondary | ICD-10-CM | POA: Diagnosis not present

## 2019-05-24 DIAGNOSIS — M9902 Segmental and somatic dysfunction of thoracic region: Secondary | ICD-10-CM | POA: Diagnosis not present

## 2019-05-24 DIAGNOSIS — M5442 Lumbago with sciatica, left side: Secondary | ICD-10-CM | POA: Diagnosis not present

## 2019-05-30 ENCOUNTER — Ambulatory Visit: Payer: 59 | Admitting: Family Medicine

## 2019-05-31 ENCOUNTER — Other Ambulatory Visit: Payer: Self-pay

## 2019-05-31 ENCOUNTER — Ambulatory Visit (HOSPITAL_COMMUNITY)
Admission: RE | Admit: 2019-05-31 | Discharge: 2019-05-31 | Disposition: A | Discharge status: Home / Self Care | Payer: 59 | Source: Ambulatory Visit | Attending: Family Medicine | Admitting: Family Medicine

## 2019-05-31 DIAGNOSIS — M5432 Sciatica, left side: Secondary | ICD-10-CM | POA: Insufficient documentation

## 2019-05-31 DIAGNOSIS — M48061 Spinal stenosis, lumbar region without neurogenic claudication: Secondary | ICD-10-CM | POA: Diagnosis not present

## 2019-06-02 ENCOUNTER — Encounter: Payer: Self-pay | Admitting: Family Medicine

## 2019-06-02 ENCOUNTER — Telehealth: Payer: Self-pay | Admitting: Family Medicine

## 2019-06-02 MED ORDER — GABAPENTIN 100 MG PO CAPS: ORAL_CAPSULE | ORAL | 1 refills | Status: DC

## 2019-06-02 MED FILL — GABAPENTIN 100 MG CAPSULE: 100 | 20 days supply | Qty: 180 | Fill #0

## 2019-06-02 NOTE — Telephone Encounter (Signed)
Patient would like Dr. Deirdre Priest to give him a call back. He forgot to ask Dr. Deirdre Priest this morning if he could call in an oil steroid. Please call patient to discuss.

## 2019-06-02 NOTE — Telephone Encounter (Signed)
Spoke with him re MRI  He is feeling some better but still having pain  No significant weakness  Gabapentin helping some  Has appointment to see Dr Karen Chafe next week to discuss MRI  I briefly discussed possible surgery but he is unsure  He would like to keep to appointment to further discuss  I will send in refill of gabapentin

## 2019-06-02 NOTE — Telephone Encounter (Signed)
Discussed steroids oral.  I related there was little evidence of benefit acutely and would be less likely to help this late.  Also would increase the risk of elevated blood sugar and infection  He agrees

## 2019-06-06 ENCOUNTER — Ambulatory Visit (INDEPENDENT_AMBULATORY_CARE_PROVIDER_SITE_OTHER): Payer: 59 | Admitting: Family Medicine

## 2019-06-06 ENCOUNTER — Other Ambulatory Visit: Payer: Self-pay

## 2019-06-06 VITALS — BP 164/98 | HR 83 | Wt 168.4 lb

## 2019-06-06 DIAGNOSIS — M5432 Sciatica, left side: Secondary | ICD-10-CM | POA: Diagnosis not present

## 2019-06-06 NOTE — Assessment & Plan Note (Signed)
Chronic, stable. I did discuss his results showed a bulging disc at L4-L5 but also signs of arthritis. Overall, condition is improving but still having daily episodes of pain that make walking and staying active difficult. Patient declines formal PT and is already doing Tylenol, Alleve, and Gabapentin with minimal improvement in symptoms. - Referral to neurosurgery to discuss options; injections vs microdiscectomy for protruding disc. - Cont conservative management for OA of spine. I recommended weight bearing exercise for relief in arthritis symptoms and formal PT.

## 2019-06-06 NOTE — Patient Instructions (Signed)
It was great to meet you today! Thank you for letting me participate in your care!  Today, we discussed your MRI results as it relates to your low back pain. I believe the best course of action is to continue conservative management with ice, heat, stretches, and formal PT. However, since you cannot do formal PT at this time I will recommend you to a surgeon for discussion of possible surgery.  Be well, Jules Schick, DO PGY-3, [facility]

## 2019-06-06 NOTE — Progress Notes (Signed)
    SUBJECTIVE:   CHIEF COMPLAINT / HPI:   Low Back Pain Patient is a 68y/o male presents today to discuss the results of his lumbar MRI. He states his pain started the week before Christmas and overall has gotten better. However, he still has episodes of dull and sometimes burning pain everyday that radiates down his left leg past his knee. It is worse with movement and when walking and relieved while sitting and laying down. Pain rated at a 6-7/10. Tylenol, Alleve, Gabapentin all help some but only "take the edge off". He is willing to have surgery if he is deemed a good candidate. He declines formal PT and requests an appointment with surgeon to discuss options.   PERTINENT  PMH / PSH: HTN, OA in multiple joints, HLD  OBJECTIVE:   BP (!) 164/98   Pulse 83   Wt 168 lb 6.4 oz (76.4 kg)   SpO2 96%   BMI 23.49 kg/m   Gen: NAD Skin: No rashes, no edema, multiple waxy scaly plaques on the back.  ASSESSMENT/PLAN:   Left sciatic nerve pain Chronic, stable. I did discuss his results showed a bulging disc at L4-L5 but also signs of arthritis. Overall, condition is improving but still having daily episodes of pain that make walking and staying active difficult. Patient declines formal PT and is already doing Tylenol, Alleve, and Gabapentin with minimal improvement in symptoms. - Referral to neurosurgery to discuss options; injections vs microdiscectomy for protruding disc. - Cont conservative management for OA of spine. I recommended weight bearing exercise for relief in arthritis symptoms and formal PT.     Arlyce Harman, DO [facility]

## 2019-06-16 MED FILL — TAMSULOSIN HCL 0.4 MG CAP: 0.4 | 90 days supply | Qty: 90 | Fill #1

## 2019-06-27 DIAGNOSIS — M5136 Other intervertebral disc degeneration, lumbar region: Secondary | ICD-10-CM | POA: Diagnosis not present

## 2019-06-27 DIAGNOSIS — M48061 Spinal stenosis, lumbar region without neurogenic claudication: Secondary | ICD-10-CM | POA: Diagnosis not present

## 2019-06-27 DIAGNOSIS — M5116 Intervertebral disc disorders with radiculopathy, lumbar region: Secondary | ICD-10-CM | POA: Diagnosis not present

## 2019-06-27 DIAGNOSIS — M47816 Spondylosis without myelopathy or radiculopathy, lumbar region: Secondary | ICD-10-CM | POA: Diagnosis not present

## 2019-07-05 DIAGNOSIS — M5116 Intervertebral disc disorders with radiculopathy, lumbar region: Secondary | ICD-10-CM | POA: Diagnosis not present

## 2019-07-05 DIAGNOSIS — M5117 Intervertebral disc disorders with radiculopathy, lumbosacral region: Secondary | ICD-10-CM | POA: Diagnosis not present

## 2019-08-01 MED FILL — CYCLOBENZAPRINE HCL 10 MG T: 10 | 17 days supply | Qty: 50 | Fill #0

## 2019-08-21 MED FILL — LOSARTAN-HCTZ 100-12.5 MG T: 100-12.5 | 90 days supply | Qty: 90 | Fill #3

## 2019-09-04 MED FILL — CYCLOBENZAPRINE HCL 10 MG T: 10 | 17 days supply | Qty: 50 | Fill #1

## 2019-09-13 MED FILL — TAMSULOSIN HCL 0.4 MG CAP: 0.4 | 90 days supply | Qty: 90 | Fill #2

## 2019-09-20 ENCOUNTER — Encounter: Payer: Self-pay | Admitting: Family Medicine

## 2019-09-20 MED ORDER — DICLOFENAC SODIUM 1 % EX GEL: 2.0000 g | Freq: Four times a day (QID) | CUTANEOUS | 0 refills | Status: DC | PRN

## 2019-09-20 MED FILL — DICLOFENAC SODIUM 1 % GEL: 1 | 13 days supply | Qty: 100 | Fill #0

## 2019-10-19 ENCOUNTER — Other Ambulatory Visit: Payer: Self-pay | Admitting: Family Medicine

## 2019-10-19 MED FILL — DICLOFENAC SODIUM 1 % GEL: 1 | 13 days supply | Qty: 100 | Fill #0

## 2019-10-27 ENCOUNTER — Encounter: Payer: Self-pay | Admitting: Family Medicine

## 2019-11-07 ENCOUNTER — Ambulatory Visit (INDEPENDENT_AMBULATORY_CARE_PROVIDER_SITE_OTHER): Payer: 59 | Admitting: Family Medicine

## 2019-11-07 ENCOUNTER — Other Ambulatory Visit: Payer: Self-pay | Admitting: Family Medicine

## 2019-11-07 ENCOUNTER — Other Ambulatory Visit: Payer: Self-pay

## 2019-11-07 VITALS — BP 152/98 | HR 91 | Wt 174.6 lb

## 2019-11-07 DIAGNOSIS — M159 Polyosteoarthritis, unspecified: Secondary | ICD-10-CM | POA: Diagnosis not present

## 2019-11-07 DIAGNOSIS — I10 Essential (primary) hypertension: Secondary | ICD-10-CM

## 2019-11-07 DIAGNOSIS — E7800 Pure hypercholesterolemia, unspecified: Secondary | ICD-10-CM

## 2019-11-07 DIAGNOSIS — Z125 Encounter for screening for malignant neoplasm of prostate: Secondary | ICD-10-CM

## 2019-11-07 DIAGNOSIS — M5432 Sciatica, left side: Secondary | ICD-10-CM

## 2019-11-07 MED ORDER — ACETAMINOPHEN 500 MG PO TABS: 500.0000 mg | ORAL_TABLET | Freq: Four times a day (QID) | ORAL | 0 refills | Status: AC | PRN

## 2019-11-07 MED ORDER — NAPROXEN 500 MG PO TABS: 500.0000 mg | ORAL_TABLET | Freq: Two times a day (BID) | ORAL | 1 refills | Status: DC | PRN

## 2019-11-07 MED FILL — NAPROXEN 500 MG TABLET: 500 | 30 days supply | Qty: 60 | Fill #0

## 2019-11-07 NOTE — Assessment & Plan Note (Signed)
Worsened.  His symptoms and exam are consistent with DJD.  Will try multiple analgesics - see after visit summary.  If not improving he would like to see a rheumatologist

## 2019-11-07 NOTE — Progress Notes (Signed)
    SUBJECTIVE:   CHIEF COMPLAINT / HPI:   ARTHRITIS Pain in his neck, L shoulder, finger joints and knees and lateral hips.  Feels like his knees sometimes swell.  No rash or redness.  Taking tylenol 1-2 per day. Sometimes takes gabapentin at night to help sleep  HYPERTENSION Home BP Monitoring readings: his cuff may not be working  Chest pain- no     Medication Adherence  Taking daily took today about 2 hours before visit. Lightheadedness-  no  Edema- no  HYPERLIPIDEMIA Medication Adherence- daily crestor Right upper quadrant pain- no  Muscle aches- no   PERTINENT  PMH / PSH: his sciatic nerve pain on L is improving  OBJECTIVE:   BP (!) 152/98   Pulse 91   Wt 174 lb 9.6 oz (79.2 kg)   SpO2 97%   BMI 24.35 kg/m   Mobility:able to get up and down from exam table without assistance or distress All joints are FROM without effusions or warmth or erythema He does not have pain with range of motion of hips but is tender laterally diffusely over greater trochanter and iliac crest.  Mild deformity of distal IP joints  Heart - Regular rate and rhythm.  No murmurs, gallops or rubs.    Lungs:  Normal respiratory effort, chest expands symmetrically. Lungs are clear to auscultation, no crackles or wheezes.   ASSESSMENT/PLAN:   Hypertension Not controlled today in office.  Monitor at home with cuff.  Check bmet   Left sciatic nerve pain Improving   Osteoarthrosis, generalized, multiple joints Worsened.  His symptoms and exam are consistent with DJD.  Will try multiple analgesics - see after visit summary.  If not improving he would like to see a rheumatologist   Pure hypercholesterolemia Stable Check LDL Continue crestor      Carney Living, MD [facility]

## 2019-11-07 NOTE — Patient Instructions (Addendum)
Good to see you today!  Thanks for coming in.  For the BP  Get a blood pressure cuff   Check it at home   Write down the readings  Send me the readings in my chart after a few days  For Arthritis  Use the voltaren gel especialy on small joints  Take tylenol arthritis four times a day as needed  Use Naprosyn twice a day as needed with food  I will call you if your tests are not good.  Otherwise, I will send you a message on MyChart (if it is active) or a letter in the mail..  If you do not hear from me with in 2 weeks please call our office.  Bring your blood pressure cuff and all your medications     Come back in 3-4 weeks

## 2019-11-07 NOTE — Assessment & Plan Note (Signed)
Improving.

## 2019-11-07 NOTE — Assessment & Plan Note (Addendum)
Not controlled today in office.  Monitor at home with cuff.  Check bmet

## 2019-11-07 NOTE — Assessment & Plan Note (Signed)
Stable Check LDL Continue crestor

## 2019-11-08 ENCOUNTER — Other Ambulatory Visit (HOSPITAL_COMMUNITY): Payer: Self-pay | Admitting: Internal Medicine

## 2019-11-08 LAB — BASIC METABOLIC PANEL
BUN/Creatinine Ratio: 14 (ref 10–24)
BUN: 16 mg/dL (ref 8–27)
CO2: 24 mmol/L (ref 20–29)
Calcium: 9.6 mg/dL (ref 8.6–10.2)
Chloride: 96 mmol/L (ref 96–106)
Creatinine, Ser: 1.13 mg/dL (ref 0.76–1.27)
GFR calc Af Amer: 76 mL/min/{1.73_m2} (ref >59)
GFR calc non Af Amer: 65 mL/min/{1.73_m2} (ref >59)
Glucose: 92 mg/dL (ref 65–99)
Potassium: 3.8 mmol/L (ref 3.5–5.2)
Sodium: 135 mmol/L (ref 134–144)

## 2019-11-08 LAB — LDL CHOLESTEROL, DIRECT: LDL Direct: 170 mg/dL — ABNORMAL HIGH (ref 0–99)

## 2019-11-08 LAB — PSA: Prostate Specific Ag, Serum: 1.8 ng/mL (ref 0.0–4.0)

## 2019-11-08 MED FILL — SHINGRIX 50 MCG SUS: 50 | 1 days supply | Qty: 1 | Fill #0

## 2019-11-08 MED FILL — OMRON 3 SERIES BP MONITOR D: 1 days supply | Qty: 1 | Fill #0

## 2019-11-10 ENCOUNTER — Other Ambulatory Visit: Payer: Self-pay | Admitting: Family Medicine

## 2019-11-10 MED ORDER — SILDENAFIL CITRATE 100 MG PO TABS: 50.0000 mg | ORAL_TABLET | Freq: Every day | ORAL | 3 refills | Status: DC | PRN

## 2019-11-10 NOTE — Telephone Encounter (Signed)
Patient would like to have his sildenafil refilled to [facility] pharmacy.

## 2019-11-15 ENCOUNTER — Other Ambulatory Visit: Payer: Self-pay | Admitting: Family Medicine

## 2019-11-15 MED FILL — ROSUVASTATIN CALCIUM 10 MG: 10 | 90 days supply | Qty: 90 | Fill #0

## 2019-11-15 MED FILL — DICLOFENAC SODIUM 1 % GEL: 1 | 13 days supply | Qty: 100 | Fill #1

## 2019-11-15 MED FILL — SILDENAFIL CITRATE 100 MG T: 100 | 30 days supply | Qty: 6 | Fill #0

## 2019-11-15 MED FILL — GABAPENTIN 100 MG CAPSULE: 100 | 20 days supply | Qty: 180 | Fill #0

## 2019-11-23 ENCOUNTER — Encounter: Payer: Self-pay | Admitting: Family Medicine

## 2019-11-23 ENCOUNTER — Other Ambulatory Visit: Payer: Self-pay

## 2019-11-23 ENCOUNTER — Other Ambulatory Visit: Payer: Self-pay | Admitting: Family Medicine

## 2019-11-23 ENCOUNTER — Ambulatory Visit (INDEPENDENT_AMBULATORY_CARE_PROVIDER_SITE_OTHER): Payer: 59 | Admitting: Family Medicine

## 2019-11-23 DIAGNOSIS — M79605 Pain in left leg: Secondary | ICD-10-CM

## 2019-11-23 MED ORDER — GABAPENTIN 300 MG PO CAPS: 300.0000 mg | ORAL_CAPSULE | Freq: Three times a day (TID) | ORAL | 3 refills | Status: DC

## 2019-11-23 NOTE — Progress Notes (Signed)
Office Visit Note   Patient: Nicholas Houston           Date of Birth: [birth year]           MRN: [id number] Visit Date: 11/23/2019 Requested by: Carney Living, MD [address] [place],  [place] [address] PCP: Carney Living, MD  Subjective: Chief Complaint  Patient presents with   Left Leg - Pain    Pain in the hip and knee, and into the left heel. S/p surgery on his lower back (Dr. Lovell Sheehan) on 07/05/19. Cannot walk without pain.    HPI: Is here with left leg pain.  Symptoms originally started last December.  He woke up 1 day with pain down the leg and in the low back.  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  He underwent surgery per Dr. Lovell Sheehan on June 9.  He has continued to have pain in the leg, radiating from the lateral hip down the front of the knee and into the lateral foot.  He is currently taking gabapentin 100 mg 3 times daily, it helps a little bit.  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  Denies any bowel or bladder dysfunction.               ROS: No rash.  All other systems were reviewed and are negative.  Objective: Vital Signs: There were no vitals taken for this visit.  Physical Exam:  General:  Alert and oriented, in no acute distress. Pulm:  Breathing unlabored. Psy:  Normal mood, congruent affect.  Low back: Well-healed midline surgical scar.  Tender to palpation in the left sciatic notch area.  Straight leg raise is equivocal, no pain and good range of motion with passive internal hip rotation.  No effusion in the left knee.  No pain with patella compression or apprehension, no significant joint line tenderness today.  Lower extremity strength and reflexes are normal.  Imaging: No results found.  Assessment &  Plan: 1.  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 -Elected to increase gabapentin to 300 mg 3 times daily.  I suggested lumbar epidural steroid injection and ordered one for him.  He will discuss these things with Dr. Lovell Sheehan next week as well. -Consider nerve conduction studies if he fails to improve.     Procedures: No procedures performed  No notes on file     PMFS History: Patient Active Problem List   Diagnosis Date Noted   Left sciatic nerve pain 01/18/2019   Left hydrocele 08/19/2017   Hypertension 05/05/2017   Foot deformity, bilateral 09/11/2015   BPH (benign prostatic hyperplasia) 09/11/2015   Osteoarthrosis, generalized, multiple joints 09/11/2015   Pure hypercholesterolemia 09/11/2015   Past Medical History:  Diagnosis Date   Arthritis    ED (erectile dysfunction)    Elevated PSA    Hyperlipidemia    Left hydrocele    Nodular prostate with lower urinary tract symptoms    Renal cyst, right    Wears contact lenses     Family History  Problem Relation Age of Onset   Stroke Mother    Hyperlipidemia Father    Hypertension Father     Past Surgical History:  Procedure Laterality Date   APPENDECTOMY  1959   HYDROCELE  EXCISION Left 08/19/2017   Procedure: HYDROCELECTOMY ADULT;  Surgeon: Bjorn Pippin, MD;  Location: [facility];  Service: Urology;  Laterality: Left;   INGUINAL HERNIA REPAIR Bilateral left 2006;  right 1980s   TONSILLECTOMY  1956   Social History   Occupational History   Not on file  Tobacco Use   Smoking status: Former Smoker    Years: 1.00    Types: Cigarettes    Quit date: 08/17/1961    Years since quitting: 58.3   Smokeless tobacco: Never Used  Vaping Use   Vaping Use: Never used  Substance and Sexual Activity   Alcohol use: No   Drug use: Never   Sexual activity: Not on file

## 2019-12-01 DIAGNOSIS — M5116 Intervertebral disc disorders with radiculopathy, lumbar region: Secondary | ICD-10-CM | POA: Diagnosis not present

## 2019-12-05 MED FILL — GABAPENTIN 300 MG CAPSULE: 300 | 30 days supply | Qty: 90 | Fill #0

## 2019-12-07 ENCOUNTER — Other Ambulatory Visit (HOSPITAL_COMMUNITY): Payer: Self-pay | Admitting: Neurosurgery

## 2019-12-08 MED FILL — CYCLOBENZAPRINE HCL 10 MG T: 10 | 17 days supply | Qty: 50 | Fill #0

## 2019-12-12 ENCOUNTER — Other Ambulatory Visit: Payer: Self-pay | Admitting: Family Medicine

## 2019-12-12 ENCOUNTER — Ambulatory Visit (INDEPENDENT_AMBULATORY_CARE_PROVIDER_SITE_OTHER): Payer: 59 | Admitting: Family Medicine

## 2019-12-12 ENCOUNTER — Other Ambulatory Visit: Payer: Self-pay

## 2019-12-12 DIAGNOSIS — E7800 Pure hypercholesterolemia, unspecified: Secondary | ICD-10-CM | POA: Diagnosis not present

## 2019-12-12 DIAGNOSIS — I10 Essential (primary) hypertension: Secondary | ICD-10-CM

## 2019-12-12 DIAGNOSIS — M159 Polyosteoarthritis, unspecified: Secondary | ICD-10-CM | POA: Diagnosis not present

## 2019-12-12 MED ORDER — AMLODIPINE BESYLATE 10 MG PO TABS: 10.0000 mg | ORAL_TABLET | Freq: Every day | ORAL | 3 refills | Status: DC

## 2019-12-12 MED FILL — AMLODIPINE BESYLATE 10 MG T: 10 | 90 days supply | Qty: 90 | Fill #0

## 2019-12-12 NOTE — Progress Notes (Signed)
    SUBJECTIVE:   CHIEF COMPLAINT / HPI:   CHOLESTEROL He restarted his crestor after last visit a month ago.  Taking without problems  HYPERTENSION Brings in his cuff.  Most readings are in the 160s/upper 90s.  His cuff read 160/91 while our reading was 178/98.  He is taking his Hyzaar regularly.  No chest pain or shortness of breath or edema  PERTINENT  PMH / PSH: would like referral to rheumatology to see if any other treatments for arthritis  OBJECTIVE:   BP (!) 178/98   Pulse 91   Wt 178 lb (80.7 kg)   SpO2 95%   BMI 24.83 kg/m     ASSESSMENT/PLAN:   Pure hypercholesterolemia Not controlled.  Restarted crestor.  Will check labs in next few months  Hypertension Not well controlled.   Recent Bmet was normal.  No obvious cause although using NSAIDs for pain relief.  Has been high on and off for 2 years.  Will start amlodipine and monitor. May need ambulatory blood pressure monitoring   Osteoarthrosis, generalized, multiple joints Will put in referral to rheumatology at patient request      Carney Living, MD [facility]

## 2019-12-12 NOTE — Patient Instructions (Addendum)
Good to see you today!  Thanks for coming in.  I would stop the Asa tablet  For the BP Start amlodipine 10 mg tablet once a day.  Can take with your losartan  Your goal blood pressure is less than 140/90.  Check your blood pressure several times a week.  If regularly higher than this please let me know - either with MyChart or leaving a phone message. Next visit please bring in your blood pressure cuff.    For the Cholesterol Keep taking Crestor every day We should check a blood test next visit  For the Arthritis Keep taking medications as you are I will refer you to a rheumatologist  Come back in about 6 weeks to check your blood pressure and your cholesterol

## 2019-12-13 NOTE — Assessment & Plan Note (Signed)
Will put in referral to rheumatology at patient request

## 2019-12-13 NOTE — Assessment & Plan Note (Signed)
Not controlled.  Restarted crestor.  Will check labs in next few months

## 2019-12-13 NOTE — Assessment & Plan Note (Signed)
Not well controlled.   Recent Bmet was normal.  No obvious cause although using NSAIDs for pain relief.  Has been high on and off for 2 years.  Will start amlodipine and monitor. May need ambulatory blood pressure monitoring

## 2019-12-18 ENCOUNTER — Other Ambulatory Visit: Payer: Self-pay | Admitting: Family Medicine

## 2019-12-19 ENCOUNTER — Other Ambulatory Visit: Payer: Self-pay | Admitting: Family Medicine

## 2019-12-19 MED FILL — LOSARTAN-HCTZ 100-12.5 MG T: 100-12.5 | 90 days supply | Qty: 90 | Fill #0

## 2020-01-01 ENCOUNTER — Telehealth: Payer: Self-pay | Admitting: Physical Medicine and Rehabilitation

## 2020-01-01 NOTE — Telephone Encounter (Signed)
Pt called wanting to schedule his appt  [phone]

## 2020-01-02 NOTE — Telephone Encounter (Signed)
Called pt back LVM #1

## 2020-01-03 NOTE — Telephone Encounter (Signed)
Pt called stating he hasn't been getting any of the calls; pt would like to be tried again this afternoon please   [phone]

## 2020-01-03 NOTE — Telephone Encounter (Signed)
Called pt and lvm #2 

## 2020-01-11 MED FILL — SHINGRIX 50 MCG SUS: 50 | 1 days supply | Qty: 1 | Fill #1

## 2020-01-12 MED FILL — GABAPENTIN 300 MG CAPSULE: 300 | 30 days supply | Qty: 90 | Fill #1

## 2020-01-14 NOTE — Progress Notes (Signed)
Office Visit Note  Patient: Nicholas Houston             Date of Birth: 24-Mar-1950           MRN: [id number]             PCP: Carney Living, MD Referring: Carney Living, * Visit Date: 01/15/2020 Occupation: Boiler room operator for [employer]  Subjective:  New Patient (Initial Visit) (Arthritis)   History of Present Illness: Nicholas Houston is a 69 y.o. male here for evaluation of osteoarthritis. He has joint pain affecting multiple areas worst in the low back and left leg. These are progressive since years ago but worsened more abruptly earlier this year due to left leg radiculopathy was found to have a significant nerve impingement on the left at L3-L4 and underwent surgery for this in June. He did benefit from this but continues to have more pain and it bothers him a lot when he is on his feet all day for work. He does not generally notice joint swelling or erythema with these symptoms. He denies falls. He is taking ibuprofen 4 times per day most days as this helps his symptoms but benefits wear off quickly.   Imaging reviewed 05/2019 MRI Lumbar spine  1. Mild multifactorial spinal and bilateral lateral recess stenosis at L2-3. 2. Moderately severe to severe multifactorial spinal and bilateral lateral recess stenosis at L3-4. There is also a large left foraminal disc extrusion affecting the left L3 nerve root and likely responsible for the patient's radicular symptoms. 3. Mild spinal and moderate bilateral lateral recess stenosis and moderate right and mild left foraminal stenosis at L4-5.   Activities of Daily Living:  Patient reports morning stiffness for 24 hours.   Patient Denies nocturnal pain.  Difficulty dressing/grooming: Reports Difficulty climbing stairs: Reports Difficulty getting out of chair: Reports Difficulty using hands for taps, buttons, cutlery, and/or writing: Reports  Review of Systems  Constitutional: Positive for fatigue.  HENT: Positive  for mouth dryness.   Eyes: Positive for dryness.  Respiratory: Negative for shortness of breath.   Cardiovascular: Positive for swelling in legs/feet.  Gastrointestinal: Negative for constipation.  Endocrine: Positive for cold intolerance.  Genitourinary: Negative for difficulty urinating.  Musculoskeletal: Positive for arthralgias, joint pain, joint swelling, muscle weakness, morning stiffness and muscle tenderness.  Skin: Negative for rash.  Allergic/Immunologic: Negative for susceptible to infections.  Neurological: Positive for numbness and weakness.  Hematological: Positive for bruising/bleeding tendency.  Psychiatric/Behavioral: Positive for sleep disturbance.    PMFS History:  Patient Active Problem List   Diagnosis Date Noted  . Lateral pain of left hip 01/15/2020  . Left sciatic nerve pain 01/18/2019  . Left hydrocele 08/19/2017  . Hypertension 05/05/2017  . Foot deformity, bilateral 09/11/2015  . BPH (benign prostatic hyperplasia) 09/11/2015  . Osteoarthrosis, generalized, multiple joints 09/11/2015  . Pure hypercholesterolemia 09/11/2015    Past Medical History:  Diagnosis Date  . Arthritis   . ED (erectile dysfunction)   . Elevated PSA   . Hyperlipidemia   . Left hydrocele   . 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 Renal cyst, right   . Wears contact lenses     Family History  Problem Relation Age of Onset  . Stroke Mother   . Hyperlipidemia Father   . Hypertension Father    Past Surgical History:  Procedure Laterality Date  . APPENDECTOMY  1959  . BACK SURGERY    . HYDROCELE EXCISION  Left 08/19/2017   Procedure: HYDROCELECTOMY ADULT;  Surgeon: Bjorn Pippin, MD;  Location: [facility];  Service: Urology;  Laterality: Left;  . INGUINAL HERNIA REPAIR Bilateral left 2006;  right 1980s  . TONSILLECTOMY  1956   Social History   Social History Narrative   Lives with wife   Works as Pharmacist, community at [employer] History  Administered Date(s) Administered  . Influenza-Unspecified 10/26/2015  . Zoster Recombinat (Shingrix) 11/09/2019, 01/16/2020     Objective: Vital Signs: BP (!) 161/89 (BP Location: Right Arm, Patient Position: Sitting, Cuff Size: Normal)   Pulse 86   Resp 16   Ht 5\' 10"  (1.778 m)   Wt 180 lb (81.6 kg)   BMI 25.83 kg/m    Physical Exam HENT:     Mouth/Throat:     Mouth: Mucous membranes are moist.     Pharynx: Oropharynx is clear.  Eyes:     Conjunctiva/sclera: Conjunctivae normal.  Cardiovascular:     Rate and Rhythm: Normal rate and regular rhythm.  Pulmonary:     Effort: Pulmonary effort is normal.     Breath sounds: Normal breath sounds.  Skin:    General: Skin is warm and dry.     Findings: No rash.  Neurological:     General: No focal deficit present.     Mental Status: He is alert.     Comments: 2+ bilateral knee jerk and ankle jerk reflexes  Psychiatric:        Mood and Affect: Mood normal.     Musculoskeletal Exam:  Neck tightness with lateral rotation b/l Shoulder, elbow, wrist, fingers full range of motion no tenderness or swelling, few heberdons nodes present Left lumbar paraspinal tenderness to palpation Left lateral hip pain, pain with Pace maneuver, pain with straight leg raise Normal hip internal and external rotation bilaterally Left quandriceps tenderness and pain with resisted knee extension Knees full ROM    Investigation: No additional findings.  Imaging: No results found.  Recent Labs: Lab Results  Component Value Date   WBC 4.4 12/04/2015   HGB 12.0 (L) 08/20/2017   PLT 214 12/04/2015   NA 135 11/07/2019   K 3.8 11/07/2019   CL 96 11/07/2019   CO2 24 11/07/2019   GLUCOSE 92 11/07/2019   BUN 16 11/07/2019   CREATININE 1.13 11/07/2019   BILITOT 0.4 09/06/2018   ALKPHOS 42 09/06/2018   AST 19 09/06/2018   ALT 17 09/06/2018   PROT 6.7 09/06/2018   ALBUMIN 4.2 09/06/2018   CALCIUM 9.6 11/07/2019    GFRAA 76 11/07/2019    Speciality Comments: No specialty comments available.  Procedures:  No procedures performed Allergies: Patient has no known allergies.   Assessment / Plan:     Visit Diagnoses: Osteoarthrosis, generalized, multiple joints  Lateral pain of left hip - Plan: meloxicam (MOBIC) 15 MG tablet Left sciatic nerve pain  Mr. Salvucci has OA of multiple sites currently his left leg pain has some radicular symptoms but also clear component of lateral hip pain and possible impingement or piriformis syndrome. Based on this I recommend daily stretching exercises for him to address this. Also provided Rx for meloxicam for use daily PRN in place of his ibuprofen 3-4 times daily OTC treatment. He is not interested in referral for trying hip injections at this time. No evidence of inflammatory disease so he can f/u PRN if requiring repeat assessment, referral, or injection treatments for OA.  Orders: No orders  of the defined types were placed in this encounter.  Meds ordered this encounter  Medications  . meloxicam (MOBIC) 15 MG tablet    Sig: Take 1 tablet (15 mg total) by mouth daily as needed for pain.    Dispense:  90 tablet    Refill:  1    Follow-Up Instructions: Return if symptoms worsen or fail to improve.   Fuller Plan, MD  Note - This record has been created using [employer].  Chart creation errors have been sought, but may not always  have been located. Such creation errors do not reflect on  the standard of medical care.

## 2020-01-15 ENCOUNTER — Other Ambulatory Visit: Payer: Self-pay

## 2020-01-15 ENCOUNTER — Encounter: Payer: Self-pay | Admitting: Internal Medicine

## 2020-01-15 ENCOUNTER — Other Ambulatory Visit: Payer: Self-pay | Admitting: Internal Medicine

## 2020-01-15 ENCOUNTER — Ambulatory Visit (INDEPENDENT_AMBULATORY_CARE_PROVIDER_SITE_OTHER): Payer: 59 | Admitting: Internal Medicine

## 2020-01-15 VITALS — BP 161/89 | HR 86 | Resp 16 | Ht 70.0 in | Wt 180.0 lb

## 2020-01-15 DIAGNOSIS — M25552 Pain in left hip: Secondary | ICD-10-CM | POA: Diagnosis not present

## 2020-01-15 DIAGNOSIS — M5432 Sciatica, left side: Secondary | ICD-10-CM | POA: Diagnosis not present

## 2020-01-15 DIAGNOSIS — M159 Polyosteoarthritis, unspecified: Secondary | ICD-10-CM

## 2020-01-15 MED ORDER — MELOXICAM 15 MG PO TABS: 15.0000 mg | ORAL_TABLET | Freq: Every day | ORAL | 1 refills | Status: DC | PRN

## 2020-01-15 MED FILL — MELOXICAM 15 MG TABLET: 15 | 90 days supply | Qty: 90 | Fill #0

## 2020-01-15 NOTE — Patient Instructions (Signed)
Quadriceps and Hip Exercises Ask your health care provider which exercises are safe for you. Do exercises exactly as told by your health care provider and adjust them as directed. It is normal to feel mild stretching, pulling, tightness, or discomfort as you do these exercises. Stop right away if you feel sudden pain or your pain gets worse. Do not begin these exercises until told by your health care provider. Stretching and range-of-motion exercises Quadriceps stretch, prone  1. Lie on your abdomen on a firm surface, such as a bed or padded floor (prone position). 2. Bend your left / right knee and hold your ankle. If you cannot reach your ankle or pant leg, loop a belt around your foot and grab the belt instead. 3. Gently pull your heel toward your buttocks. Your knee should not slide out to the side. You should feel a stretch in the front of your thigh and knee (quadriceps). 4. Hold this position for 10 seconds. Repeat 3 times. Complete this exercise 2 times a day. Hip rotation This is an exercise in which you lie on your back and stretch the muscles that rotate your hip (hip rotators) to stretch your buttocks. 1. Lie on your back on a firm surface. 2. Pull your left / right knee toward your same shoulder with your left / right hand until your knee is pointing toward the ceiling. Hold your left / right ankle with your other hand. 3. Keeping your knee steady, gently pull your left / right ankle toward your other shoulder until you feel a stretch in your buttocks. 4. Hold this position for 10 seconds. Repeat 3 times. Complete this exercise 2 times a day. Hip extensor This is an exercise in which you lie on your back and pull your knee to your chest. 1. Lie on your back on a firm surface. Both of your legs should be straight. 2. Pull your left / right knee to your chest. Hold your leg in this position by holding onto the back of your thigh or the front of your knee. 3. Hold this position for 10  seconds. 4. Slowly return to the starting position. Repeat 3 times. Complete this exercise 2 times a day.     Straight leg raises, side-lying This exercise strengthens the muscles that rotate the leg at the hip and move it away from your body (hip abductors). 1. Lie on your side with your left / right leg in the top position. Lie so your head, shoulder, knee, and hip line up. Bend your bottom knee to help you balance. 2. Lift your top leg 4-6 inches (10-15 cm) while keeping your toes pointed straight ahead. 3. Hold this position for 2 seconds. 4. Slowly lower your leg to the starting position. 5. Let your muscles relax completely after each repetition. Repeat 10 times. Complete this exercise 2 times a day.  Straight leg raises, supine This exercise stretches the muscles in front of your thigh (quadriceps) and the muscles that move your hips (hip flexors). Quality counts! Watch for signs that the quadriceps muscle is working to ensure that you are strengthening the correct muscles and not cheating by using healthier muscles. 1. Lie on your back (supine position) with your left / right leg extended and your other knee bent. 2. Tense the muscles in the front of your left / right thigh. You should see your kneecap slide up or see increased dimpling just above the knee. 3. Tighten these muscles even more and raise your  leg 4-6 inches (10-15 cm) off the floor. 4. Hold this position for 2 seconds. 5. Keep the thigh muscles tense as you lower your leg. 6. Relax the muscles slowly and completely after each repetition. Repeat 10 times. Complete this exercise 2 times a day.     Meloxicam capsules What is this medicine? MELOXICAM (mel OX i cam) is a non-steroidal anti-inflammatory drug (NSAID). It is used to reduce swelling and to treat pain. It is used for osteoarthritis. This medicine may be used for other purposes; ask your health care provider or pharmacist if you have questions. COMMON  BRAND NAME(S): Vivlodex What should I tell my health care provider before I take this medicine? They need to know if you have any of these conditions:  bleeding disorders  cigarette smoker  coronary artery bypass graft (CABG) surgery within the past 2 weeks  drink more than 3 alcohol-containing drinks per day  heart disease  high blood pressure  history of stomach bleeding  kidney disease  liver disease  lung or breathing disease, like asthma  stomach or intestine problems  an unusual or allergic reaction to meloxicam, aspirin, other NSAIDs, other medicines, foods, dyes, or preservatives  pregnant or trying to get pregnant  breast-feeding How should I use this medicine? Take this medicine by mouth with a full glass of water. Follow the directions on the prescription label. You can take it with or without food. If it upsets your stomach, take it with food. Take your medicine at regular intervals. Do not take it more often than directed. Do not stop taking except on your doctor's advice. A special MedGuide will be given to you by the pharmacist with each prescription and refill. Be sure to read this information carefully each time. Talk to your pediatrician regarding the use of this medicine in children. Special care may be needed. Patients over 24 years old may have a stronger reaction and need a smaller dose. Overdosage: If you think you have taken too much of this medicine contact a poison control center or emergency room at once. NOTE: This medicine is only for you. Do not share this medicine with others. What if I miss a dose? If you miss a dose, take it as soon as you can. If it is almost time for your next dose, take only that dose. Do not take double or extra doses. What may interact with this medicine? Do not take this medicine with any of the following medications:  cidofovir  ketorolac This medicine may also interact with the following medications:  aspirin  and aspirin-like medicines  certain medicines for blood pressure, heart disease, irregular heart beat  certain medicines for depression, anxiety, or psychotic disturbances  certain medicines that treat or prevent blood clots like warfarin, enoxaparin, dalteparin, apixaban, dabigatran, rivaroxaban  cyclosporine  diuretics  fluconazole  lithium  methotrexate  other NSAIDs, medicines for pain and inflammation, like ibuprofen and naproxen  pemetrexed This list may not describe all possible interactions. Give your health care provider a list of all the medicines, herbs, non-prescription drugs, or dietary supplements you use. Also tell them if you smoke, drink alcohol, or use illegal drugs. Some items may interact with your medicine. What should I watch for while using this medicine? Tell your doctor or healthcare provider if your symptoms do not start to get better or if they get worse. This medicine may cause serious skin reactions. They can happen weeks to months after starting the medicine. Contact your healthcare provider  right away if you notice fevers or flu-like symptoms with a rash. The rash may be red or purple and then turn into blisters or peeling of the skin. Or, you might notice a red rash with swelling of the face, lips or lymph nodes in your neck or under your arms. Do not take other medicines that contain aspirin, ibuprofen, or naproxen with this medicine. Side effects such as stomach upset, nausea, or ulcers may be more likely to occur. Many medicines available without a prescription should not be taken with this medicine. This medicine can cause ulcers and bleeding in the stomach and intestines at any time during treatment. This can happen with no warning and may cause death. There is increased risk with taking this medicine for a long time. Smoking, drinking alcohol, older age, and poor health can also increase risks. Call your doctor right away if you have stomach pain or  blood in your vomit or stool. This medicine does not prevent heart attack or stroke. In fact, this medicine may increase the chance of a heart attack or stroke. The chance may increase with longer use of this medicine and in people who have heart disease. If you take aspirin to prevent heart attack or stroke, talk with your doctor or healthcare provider. What side effects may I notice from receiving this medicine? Side effects that you should report to your doctor or health care professional as soon as possible:  allergic reactions like skin rash, itching or hives, swelling of the face, lips, or tongue  nausea, vomiting  redness, blistering, peeling, or loosening of the skin, including inside the mouth  signs and symptoms of a blood clot such as breathing problems; changes in vision; chest pain; severe, sudden headache; pain, swelling, warmth in the leg; trouble speaking; sudden numbness or weakness of the face, arm, or leg  signs and symptoms of bleeding such as bloody or black, tarry stools; red or dark-brown urine; spitting up blood or brown material that looks like coffee grounds; red spots on the skin; unusual bruising or bleeding from the eye, gums, or nose  signs and symptoms of liver injury like dark yellow or brown urine; general ill feeling or flu-like symptoms; light-colored stools; loss of appetite; nausea; right upper belly pain; unusually weak or tired; yellowing of the eyes or skin  signs and symptoms of stroke like changes in vision; confusion; trouble speaking or understanding; severe headaches; sudden numbness or weakness of the face, arm, or leg; trouble walking; dizziness; loss of balance or coordination Side effects that usually do not require medical attention (report to your doctor or health care professional if they continue or are bothersome):  constipation  diarrhea  gas This list may not describe all possible side effects. Call your doctor for medical advice about  side effects. You may report side effects to FDA at 1-800-FDA-1088. Where should I keep my medicine? Keep out of the reach of children. Store at room temperature between 15 and 30 degrees C (59 and 86 degrees F). Throw away any unused medicine after the expiration date. NOTE: This sheet is a summary. It may not cover all possible information. If you have questions about this medicine, talk to your doctor, pharmacist, or health care provider.  2020 Elsevier/Gold Standard (2018-04-13 11:19:03)    Osteoarthritis  Osteoarthritis is a type of arthritis that affects tissue that covers the ends of bones in joints (cartilage). Cartilage acts as a cushion between the bones and helps them move smoothly. Osteoarthritis  results when cartilage in the joints gets worn down. Osteoarthritis is sometimes called "wear and tear" arthritis. Osteoarthritis is the most common form of arthritis. It often occurs in older people. It is a condition that gets worse over time (a progressive condition). Joints that are most often affected by this condition are in:  Fingers.  Toes.  Hips.  Knees.  Spine, including neck and lower back. What are the causes? This condition is caused by age-related wearing down of cartilage that covers the ends of bones. What increases the risk? The following factors may make you more likely to develop this condition:  Older age.  Being overweight or obese.  Overuse of joints, such as in athletes.  Past injury of a joint.  Past surgery on a joint.  Family history of osteoarthritis. What are the signs or symptoms? The main symptoms of this condition are pain, swelling, and stiffness in the joint. The joint may lose its shape over time. Small pieces of bone or cartilage may break off and float inside of the joint, which may cause more pain and damage to the joint. Small deposits of bone (osteophytes) may grow on the edges of the joint. Other symptoms may include:  A grating  or scraping feeling inside the joint when you move it.  Popping or creaking sounds when you move. Symptoms may affect one or more joints. Osteoarthritis in a major joint, such as your knee or hip, can make it painful to walk or exercise. If you have osteoarthritis in your hands, you might not be able to grip items, twist your hand, or control small movements of your hands and fingers (fine motor skills). How is this diagnosed? This condition may be diagnosed based on:  Your medical history.  A physical exam.  Your symptoms.  X-rays of the affected joint(s).  Blood tests to rule out other types of arthritis. How is this treated? There is no cure for this condition, but treatment can help to control pain and improve joint function. Treatment plans may include:  A prescribed exercise program that allows for rest and joint relief. You may work with a physical therapist.  A weight control plan.  Pain relief techniques, such as: ? Applying heat and cold to the joint. ? Electric pulses delivered to nerve endings under the skin (transcutaneous electrical nerve stimulation, or TENS). ? Massage. ? Certain nutritional supplements.  NSAIDs or prescription medicines to help relieve pain.  Medicine to help relieve pain and inflammation (corticosteroids). This can be given by mouth (orally) or as an injection.  Assistive devices, such as a brace, wrap, splint, specialized glove, or cane.  Surgery, such as: ? An osteotomy. This is done to reposition the bones and relieve pain or to remove loose pieces of bone and cartilage. ? Joint replacement surgery. You may need this surgery if you have very bad (advanced) osteoarthritis. Follow these instructions at home: Activity  Rest your affected joints as directed by your health care provider.  Do not drive or use heavy machinery while taking prescription pain medicine.  Exercise as directed. Your health care provider or physical therapist may  recommend specific types of exercise, such as: ? Strengthening exercises. These are done to strengthen the muscles that support joints that are affected by arthritis. They can be performed with weights or with exercise bands to add resistance. ? Aerobic activities. These are exercises, such as brisk walking or water aerobics, that get your heart pumping. ? Range-of-motion activities. These keep your  joints easy to move. ? Balance and agility exercises. Managing pain, stiffness, and swelling      If directed, apply heat to the affected area as often as told by your health care provider. Use the heat source that your health care provider recommends, such as a moist heat pack or a heating pad. ? If you have a removable assistive device, remove it as told by your health care provider. ? Place a towel between your skin and the heat source. If your health care provider tells you to keep the assistive device on while you apply heat, place a towel between the assistive device and the heat source. ? Leave the heat on for 20-30 minutes. ? Remove the heat if your skin turns bright red. This is especially important if you are unable to feel pain, heat, or cold. You may have a greater risk of getting burned.  If directed, put ice on the affected joint: ? If you have a removable assistive device, remove it as told by your health care provider. ? Put ice in a plastic bag. ? Place a towel between your skin and the bag. If your health care provider tells you to keep the assistive device on during icing, place a towel between the assistive device and the bag. ? Leave the ice on for 20 minutes, 2-3 times a day. General instructions  Take over-the-counter and prescription medicines only as told by your health care provider.  Maintain a healthy weight. Follow instructions from your health care provider for weight control. These may include dietary restrictions.  Do not use any products that contain nicotine or  tobacco, such as cigarettes and e-cigarettes. These can delay bone healing. If you need help quitting, ask your health care provider.  Use assistive devices as directed by your health care provider.  Keep all follow-up visits as told by your health care provider. This is important. Where to find more information  [employer]: www.niams.http://www.myers.net/nih.gov  [employer]: https://walker.com/www.nia.nih.gov  American College of Rheumatology: www.rheumatology.org Contact a health care provider if:  Your skin turns red.  You develop a rash.  You have pain that gets worse.  You have a fever along with joint or muscle aches. Get help right away if:  You lose a lot of weight.  You suddenly lose your appetite.  You have night sweats. Summary  Osteoarthritis is a type of arthritis that affects tissue covering the ends of bones in joints (cartilage).  This condition is caused by age-related wearing down of cartilage that covers the ends of bones.  The main symptom of this condition is pain, swelling, and stiffness in the joint.  There is no cure for this condition, but treatment can help to control pain and improve joint function. This information is not intended to replace advice given to you by your health care provider. Make sure you discuss any questions you have with your health care provider. Document Revised: 12/25/2016 Document Reviewed: 09/16/2015 Elsevier Patient Education  2020 [employer]

## 2020-01-17 MED FILL — TAMSULOSIN HCL 0.4 MG CAP: 0.4 | 90 days supply | Qty: 90 | Fill #3

## 2020-01-24 DIAGNOSIS — M1712 Unilateral primary osteoarthritis, left knee: Secondary | ICD-10-CM | POA: Diagnosis not present

## 2020-01-24 DIAGNOSIS — M5116 Intervertebral disc disorders with radiculopathy, lumbar region: Secondary | ICD-10-CM | POA: Diagnosis not present

## 2020-01-24 DIAGNOSIS — Z9889 Other specified postprocedural states: Secondary | ICD-10-CM | POA: Diagnosis not present

## 2020-01-24 DIAGNOSIS — M48061 Spinal stenosis, lumbar region without neurogenic claudication: Secondary | ICD-10-CM | POA: Diagnosis not present

## 2020-01-24 DIAGNOSIS — M461 Sacroiliitis, not elsewhere classified: Secondary | ICD-10-CM | POA: Diagnosis not present

## 2020-01-24 DIAGNOSIS — I10 Essential (primary) hypertension: Secondary | ICD-10-CM | POA: Diagnosis not present

## 2020-01-25 ENCOUNTER — Telehealth: Payer: Self-pay | Admitting: *Deleted

## 2020-01-25 NOTE — Telephone Encounter (Signed)
I called Dr. Dimple Casey, Dr. Dimple Casey suggested the patient take 1/2 pill in the am and 1/2 pill in the pm, DO NOT INCREASE DOSE, LMOM for patient to call office to discuss recommendation.

## 2020-01-25 NOTE — Telephone Encounter (Signed)
Patient called, meloxicam was prescribed for patient on 01/15/2020, Take 1 tablet (15 mg total) by mouth daily as needed for pain, patient states meloxicam is not lasting 24 hours, patient wants to know if he can take 2 daily or if there is anything else he can do to help with the pain. Please advise.

## 2020-01-30 ENCOUNTER — Encounter: Payer: Self-pay | Admitting: Family Medicine

## 2020-01-30 ENCOUNTER — Ambulatory Visit (INDEPENDENT_AMBULATORY_CARE_PROVIDER_SITE_OTHER): Payer: 59 | Admitting: Family Medicine

## 2020-01-30 ENCOUNTER — Other Ambulatory Visit: Payer: Self-pay

## 2020-01-30 DIAGNOSIS — E7800 Pure hypercholesterolemia, unspecified: Secondary | ICD-10-CM | POA: Diagnosis not present

## 2020-01-30 DIAGNOSIS — I10 Essential (primary) hypertension: Secondary | ICD-10-CM

## 2020-01-30 DIAGNOSIS — M159 Polyosteoarthritis, unspecified: Secondary | ICD-10-CM | POA: Diagnosis not present

## 2020-01-30 NOTE — Assessment & Plan Note (Signed)
Improved control.  Not completely at goal but with recent steroid injection and regular mobic use this is likely increasing his blood pressure.  Asked to try tylenol inplace of mobic when pain is not too bad.  Will continue to monitor at home.  Could increase HCTZ dose in his losartan combo if needed

## 2020-01-30 NOTE — Progress Notes (Signed)
    SUBJECTIVE:   CHIEF COMPLAINT / HPI:   HYPERTENSION Brings in his meter.  Home readings range from 140-150s/70-80s.  His machine is 10 patients higher systolic than ours.No lightheadness or problems with swelling  ARTHRITIS Had a knee injection last week and is taking mobic twice a day.  Feels is improving some   PERTINENT  PMH / PSH: continues to work at [employer]  OBJECTIVE:   BP 128/84   Pulse 82   Wt 182 lb (82.6 kg)   SpO2 98%   BMI 26.11 kg/m   Psych:  Cognition and judgment appear intact. Alert, communicative  and cooperative with normal attention span and concentration. No apparent delusions, illusions, hallucinations   ASSESSMENT/PLAN:   Hypertension Improved control.  Not completely at goal but with recent steroid injection and regular mobic use this is likely increasing his blood pressure.  Asked to try tylenol inplace of mobic when pain is not too bad.  Will continue to monitor at home.  Could increase HCTZ dose in his losartan combo if needed   Osteoarthrosis, generalized, multiple joints Improving.  Try to cut back on NSAIDs that may be worsening blood pressure.  No evidence of inflammatory disease as per his rheumatology visit   Pure hypercholesterolemia Check LDL today      Carney Living, MD [facility]

## 2020-01-30 NOTE — Patient Instructions (Signed)
Good to see you today!  Thanks for coming in.  Your goal blood pressure is less than 140/90.  Check your blood pressure several times a week.  If regularly higher than this please let me know - either with MyChart or leaving a phone message. Next visit please bring in your blood pressure cuff.     I will call you if your tests are not good.  Otherwise, I will send you a message on MyChart (if it is active) or a letter in the mail..  If you do not hear from me with in 2 weeks please call our office.    Try to take tylenol instead of mobic when you are not in a lot of pain   Come back in 3 months for a blood pressure check

## 2020-01-30 NOTE — Assessment & Plan Note (Signed)
Check LDL today

## 2020-01-30 NOTE — Assessment & Plan Note (Signed)
Improving.  Try to cut back on NSAIDs that may be worsening blood pressure.  No evidence of inflammatory disease as per his rheumatology visit

## 2020-01-31 LAB — LIPID PANEL
Chol/HDL Ratio: 3.7 ratio (ref 0.0–5.0)
Cholesterol, Total: 191 mg/dL (ref 100–199)
HDL: 52 mg/dL (ref >39)
LDL Chol Calc (NIH): 110 mg/dL — ABNORMAL HIGH (ref 0–99)
Triglycerides: 169 mg/dL — ABNORMAL HIGH (ref 0–149)
VLDL Cholesterol Cal: 29 mg/dL (ref 5–40)

## 2020-02-26 MED FILL — ROSUVASTATIN CALCIUM 10 MG: 10 | 90 days supply | Qty: 90 | Fill #1

## 2020-02-29 DIAGNOSIS — M5116 Intervertebral disc disorders with radiculopathy, lumbar region: Secondary | ICD-10-CM | POA: Diagnosis not present

## 2020-03-12 MED FILL — AMLODIPINE BESYLATE 10 MG T: 10 | 90 days supply | Qty: 90 | Fill #1

## 2020-03-12 MED FILL — GABAPENTIN 300 MG CAPSULE: 300 | 30 days supply | Qty: 90 | Fill #2

## 2020-03-22 ENCOUNTER — Other Ambulatory Visit (HOSPITAL_COMMUNITY): Payer: Self-pay | Admitting: Student

## 2020-03-22 MED FILL — LOSARTAN-HCTZ 100-12.5 MG T: 100-12.5 | 90 days supply | Qty: 90 | Fill #1

## 2020-03-22 MED FILL — CYCLOBENZAPRINE HCL 10 MG T: 10 | 17 days supply | Qty: 50 | Fill #0

## 2020-03-28 MED FILL — DICLOFENAC SODIUM 1 % GEL: 1 | 13 days supply | Qty: 100 | Fill #2

## 2020-04-17 MED FILL — MELOXICAM 15 MG TABLET: 15 | 90 days supply | Qty: 90 | Fill #1

## 2020-05-01 ENCOUNTER — Other Ambulatory Visit (HOSPITAL_COMMUNITY): Payer: Self-pay

## 2020-05-06 ENCOUNTER — Other Ambulatory Visit: Payer: Self-pay | Admitting: Family Medicine

## 2020-05-06 ENCOUNTER — Other Ambulatory Visit (HOSPITAL_COMMUNITY): Payer: Self-pay

## 2020-05-06 MED ORDER — TAMSULOSIN HCL 0.4 MG PO CAPS
0.4000 mg | ORAL_CAPSULE | Freq: Every day | ORAL | 3 refills | Status: DC
  Filled 2020-05-06: qty 90, 90d supply, fill #0
  Filled 2020-08-07: qty 90, 90d supply, fill #1
  Filled 2020-11-10: qty 90, 90d supply, fill #2
  Filled 2021-02-12: qty 90, 90d supply, fill #3

## 2020-06-10 ENCOUNTER — Other Ambulatory Visit (HOSPITAL_COMMUNITY): Payer: Self-pay

## 2020-06-10 MED FILL — Amlodipine Besylate Tab 10 MG (Base Equivalent): ORAL | 90 days supply | Qty: 90 | Fill #0 | Status: AC

## 2020-06-11 ENCOUNTER — Other Ambulatory Visit (HOSPITAL_COMMUNITY): Payer: Self-pay

## 2020-06-11 MED FILL — Rosuvastatin Calcium Tab 10 MG: ORAL | 90 days supply | Qty: 90 | Fill #0 | Status: AC

## 2020-06-18 ENCOUNTER — Other Ambulatory Visit (HOSPITAL_COMMUNITY): Payer: Self-pay

## 2020-06-18 MED FILL — Losartan Potassium & Hydrochlorothiazide Tab 100-12.5 MG: ORAL | 90 days supply | Qty: 90 | Fill #0 | Status: AC

## 2020-07-05 ENCOUNTER — Other Ambulatory Visit (HOSPITAL_COMMUNITY): Payer: Self-pay

## 2020-07-05 MED FILL — Diclofenac Sodium Gel 1% (1.16% Diethylamine Equiv): CUTANEOUS | 25 days supply | Qty: 100 | Fill #0 | Status: AC

## 2020-07-18 ENCOUNTER — Other Ambulatory Visit: Payer: Self-pay | Admitting: Internal Medicine

## 2020-07-18 ENCOUNTER — Other Ambulatory Visit (HOSPITAL_COMMUNITY): Payer: Self-pay

## 2020-07-18 DIAGNOSIS — M25552 Pain in left hip: Secondary | ICD-10-CM

## 2020-07-18 DIAGNOSIS — M159 Polyosteoarthritis, unspecified: Secondary | ICD-10-CM

## 2020-07-18 MED ORDER — MELOXICAM 15 MG PO TABS
15.0000 mg | ORAL_TABLET | Freq: Every day | ORAL | 0 refills | Status: DC | PRN
  Filled 2020-07-18: qty 90, 90d supply, fill #0

## 2020-07-18 NOTE — Telephone Encounter (Signed)
Next Visit: follow-up PRN Last Visit: 01/15/2020  Last Fill: 01/15/2020  DX: Osteoarthrosis, generalized, multiple joints  Current Dose per office note 01/15/2020: meloxicam (MOBIC) 15 MG tablet PRN   Okay to refill Meloxicam?

## 2020-08-07 ENCOUNTER — Other Ambulatory Visit (HOSPITAL_COMMUNITY): Payer: Self-pay

## 2020-08-18 DIAGNOSIS — H524 Presbyopia: Secondary | ICD-10-CM | POA: Diagnosis not present

## 2020-09-11 ENCOUNTER — Other Ambulatory Visit (HOSPITAL_COMMUNITY): Payer: Self-pay

## 2020-09-11 MED FILL — Rosuvastatin Calcium Tab 10 MG: ORAL | 90 days supply | Qty: 90 | Fill #1 | Status: AC

## 2020-09-11 MED FILL — Amlodipine Besylate Tab 10 MG (Base Equivalent): ORAL | 90 days supply | Qty: 90 | Fill #1 | Status: AC

## 2020-09-17 ENCOUNTER — Other Ambulatory Visit (HOSPITAL_COMMUNITY): Payer: Self-pay

## 2020-09-17 MED FILL — Losartan Potassium & Hydrochlorothiazide Tab 100-12.5 MG: ORAL | 90 days supply | Qty: 90 | Fill #1 | Status: AC

## 2020-10-21 ENCOUNTER — Other Ambulatory Visit: Payer: Self-pay | Admitting: Internal Medicine

## 2020-10-21 ENCOUNTER — Other Ambulatory Visit (HOSPITAL_COMMUNITY): Payer: Self-pay

## 2020-10-21 DIAGNOSIS — M25552 Pain in left hip: Secondary | ICD-10-CM

## 2020-10-21 DIAGNOSIS — M159 Polyosteoarthritis, unspecified: Secondary | ICD-10-CM

## 2020-10-21 MED ORDER — MELOXICAM 15 MG PO TABS
15.0000 mg | ORAL_TABLET | Freq: Every day | ORAL | 0 refills | Status: DC | PRN
  Filled 2020-10-21: qty 90, 90d supply, fill #0

## 2020-10-21 NOTE — Telephone Encounter (Signed)
Next Visit: follow-up PRN Last Visit: 01/15/2020   Last Fill: 07/18/2020  DX: Osteoarthrosis, generalized, multiple joints   Current Dose per office note 01/15/2020: meloxicam (MOBIC) 15 MG tablet PRN    Okay to refill Meloxicam?

## 2020-11-11 ENCOUNTER — Other Ambulatory Visit (HOSPITAL_COMMUNITY): Payer: Self-pay

## 2020-11-20 ENCOUNTER — Encounter: Payer: Self-pay | Admitting: Family Medicine

## 2020-11-20 ENCOUNTER — Other Ambulatory Visit: Payer: Self-pay

## 2020-11-20 ENCOUNTER — Ambulatory Visit (INDEPENDENT_AMBULATORY_CARE_PROVIDER_SITE_OTHER): Payer: 59 | Admitting: Family Medicine

## 2020-11-20 ENCOUNTER — Ambulatory Visit (INDEPENDENT_AMBULATORY_CARE_PROVIDER_SITE_OTHER): Payer: 59

## 2020-11-20 ENCOUNTER — Other Ambulatory Visit (HOSPITAL_COMMUNITY): Payer: Self-pay

## 2020-11-20 DIAGNOSIS — M159 Polyosteoarthritis, unspecified: Secondary | ICD-10-CM | POA: Diagnosis not present

## 2020-11-20 DIAGNOSIS — E7800 Pure hypercholesterolemia, unspecified: Secondary | ICD-10-CM

## 2020-11-20 DIAGNOSIS — I10 Essential (primary) hypertension: Secondary | ICD-10-CM

## 2020-11-20 DIAGNOSIS — Z23 Encounter for immunization: Secondary | ICD-10-CM | POA: Diagnosis not present

## 2020-11-20 MED ORDER — CYCLOBENZAPRINE HCL 10 MG PO TABS
10.0000 mg | ORAL_TABLET | Freq: Every evening | ORAL | 1 refills | Status: DC | PRN
  Filled 2020-11-20: qty 30, 30d supply, fill #0
  Filled 2021-05-28: qty 30, 30d supply, fill #1

## 2020-11-20 MED ORDER — GABAPENTIN 100 MG PO CAPS
100.0000 mg | ORAL_CAPSULE | Freq: Two times a day (BID) | ORAL | 1 refills | Status: DC | PRN
Start: 2020-11-20 — End: 2021-08-14
  Filled 2020-11-20: qty 60, 30d supply, fill #0
  Filled 2021-01-06: qty 60, 30d supply, fill #1

## 2020-11-20 NOTE — Assessment & Plan Note (Signed)
Stable on crestor 

## 2020-11-20 NOTE — Progress Notes (Signed)
    SUBJECTIVE:   CHIEF COMPLAINT / HPI:   Here for physical.  Overall feels well  Hypertension Not checking at  home.  No edema or lightheadness Knows his meds  Arthritis Diffuse but manageable with mobic daily and as needed flexaril and gabapentin.  Still working full time.  If does not take mobic has a lot of pain   PERTINENT  PMH / PSH: Works at [employer]   OBJECTIVE:   BP (!) 141/75   Pulse 78   Ht 5\' 11"  (1.803 m)   Wt 180 lb (81.6 kg)   SpO2 98%   BMI 25.10 kg/m   Heart - Regular rate and rhythm.  No murmurs, gallops or rubs.    Lungs:  Normal respiratory effort, chest expands symmetrically. Lungs are clear to auscultation, no crackles or wheezes. Extremities:  No cyanosis, edema, or deformity noted with good range of motion of all major joints.     ASSESSMENT/PLAN:   Annual Examination Male over 30 yo  I reviewed the following patient responses on our Physical Exam Form Tobacco use and candidacy for lung cancer or aneurysm screening Alcohol Use  Weight  Exercise  Risk for STI  Fall risk Increased family cancer risk Violence risk  PHQ9 score reviewed  Blood pressure reviewed  I considered the following items based upon USPSTF recommendations: Colon cancer screening Prostate Cancer if male at birth HIV testing:  Hepatitis C testing Cholesterol screening STI screening if high risk (Hepatitis B, Syphilis, Gonorrhea, Chlamydia) Immunizations - Influenza, Covid, [place], Pneumonia, Tetanus  See After Visit Summary for recommendations   Hypertension At goal check labs   Osteoarthrosis, generalized, multiple joints Discussed as needed use of analgesics and side effects of sedation and stomach irritation  Pure hypercholesterolemia Stable on crestor      53, MD [facility]

## 2020-11-20 NOTE — Patient Instructions (Signed)
Good to see you today - Thank you for coming in  Things we discussed today:  Weight - smaller portion sizes - Goal would be to weigh 165 lbs  Take the gabapentin and the flexaril only as needed  I will call you if your tests are not good.  Otherwise, I will send you a message on MyChart (if it is active) or a letter in the mail..  If you do not hear from me with in 2 weeks please call our office.      Please always bring your medication bottles  Come back to see me in 6 months

## 2020-11-20 NOTE — Assessment & Plan Note (Signed)
At goal check labs  

## 2020-11-20 NOTE — Assessment & Plan Note (Signed)
Discussed as needed use of analgesics and side effects of sedation and stomach irritation

## 2020-11-21 LAB — CMP14+EGFR
ALT: 19 IU/L (ref 0–44)
AST: 20 IU/L (ref 0–40)
Albumin/Globulin Ratio: 1.8 (ref 1.2–2.2)
Albumin: 4.4 g/dL (ref 3.7–4.7)
Alkaline Phosphatase: 63 IU/L (ref 44–121)
BUN/Creatinine Ratio: 13 (ref 10–24)
BUN: 19 mg/dL (ref 8–27)
Bilirubin Total: 0.4 mg/dL (ref 0.0–1.2)
CO2: 24 mmol/L (ref 20–29)
Calcium: 9.8 mg/dL (ref 8.6–10.2)
Chloride: 102 mmol/L (ref 96–106)
Creatinine, Ser: 1.41 mg/dL — ABNORMAL HIGH (ref 0.76–1.27)
Globulin, Total: 2.4 g/dL (ref 1.5–4.5)
Glucose: 95 mg/dL (ref 70–99)
Potassium: 4.4 mmol/L (ref 3.5–5.2)
Sodium: 141 mmol/L (ref 134–144)
Total Protein: 6.8 g/dL (ref 6.0–8.5)
eGFR: 53 mL/min/{1.73_m2} — ABNORMAL LOW (ref >59)

## 2020-12-02 ENCOUNTER — Encounter: Payer: Self-pay | Admitting: Family Medicine

## 2020-12-17 ENCOUNTER — Other Ambulatory Visit: Payer: Self-pay | Admitting: Family Medicine

## 2020-12-18 ENCOUNTER — Other Ambulatory Visit (HOSPITAL_COMMUNITY): Payer: Self-pay

## 2020-12-18 MED ORDER — LOSARTAN POTASSIUM-HCTZ 100-12.5 MG PO TABS
1.0000 | ORAL_TABLET | Freq: Every day | ORAL | 3 refills | Status: AC
  Filled 2020-12-18: qty 90, 90d supply, fill #0
  Filled 2021-03-20: qty 90, 90d supply, fill #1
  Filled 2021-06-17: qty 90, 90d supply, fill #2
  Filled 2021-10-06: qty 90, 90d supply, fill #3

## 2020-12-18 MED ORDER — AMLODIPINE BESYLATE 10 MG PO TABS
10.0000 mg | ORAL_TABLET | Freq: Every day | ORAL | 3 refills | Status: AC
  Filled 2020-12-18: qty 90, 90d supply, fill #0
  Filled 2021-03-20: qty 90, 90d supply, fill #1
  Filled 2021-06-17: qty 90, 90d supply, fill #2

## 2020-12-18 MED ORDER — ROSUVASTATIN CALCIUM 10 MG PO TABS
10.0000 mg | ORAL_TABLET | Freq: Every day | ORAL | 3 refills | Status: AC
  Filled 2020-12-18: qty 90, 90d supply, fill #0
  Filled 2021-03-20: qty 90, 90d supply, fill #1
  Filled 2021-06-17: qty 90, 90d supply, fill #2
  Filled 2021-10-06: qty 90, 90d supply, fill #3

## 2021-01-06 ENCOUNTER — Other Ambulatory Visit (HOSPITAL_COMMUNITY): Payer: Self-pay

## 2021-01-18 IMAGING — CR DG LUMBAR SPINE 2-3V
3 series · 3 of 3 positions shown · non-contrast
Comparison: None.

CLINICAL DATA: Lumbar region back pain radiating to the left hip

EXAM:
LUMBAR SPINE - 2-3 VIEW

[w lumbar spine ap]
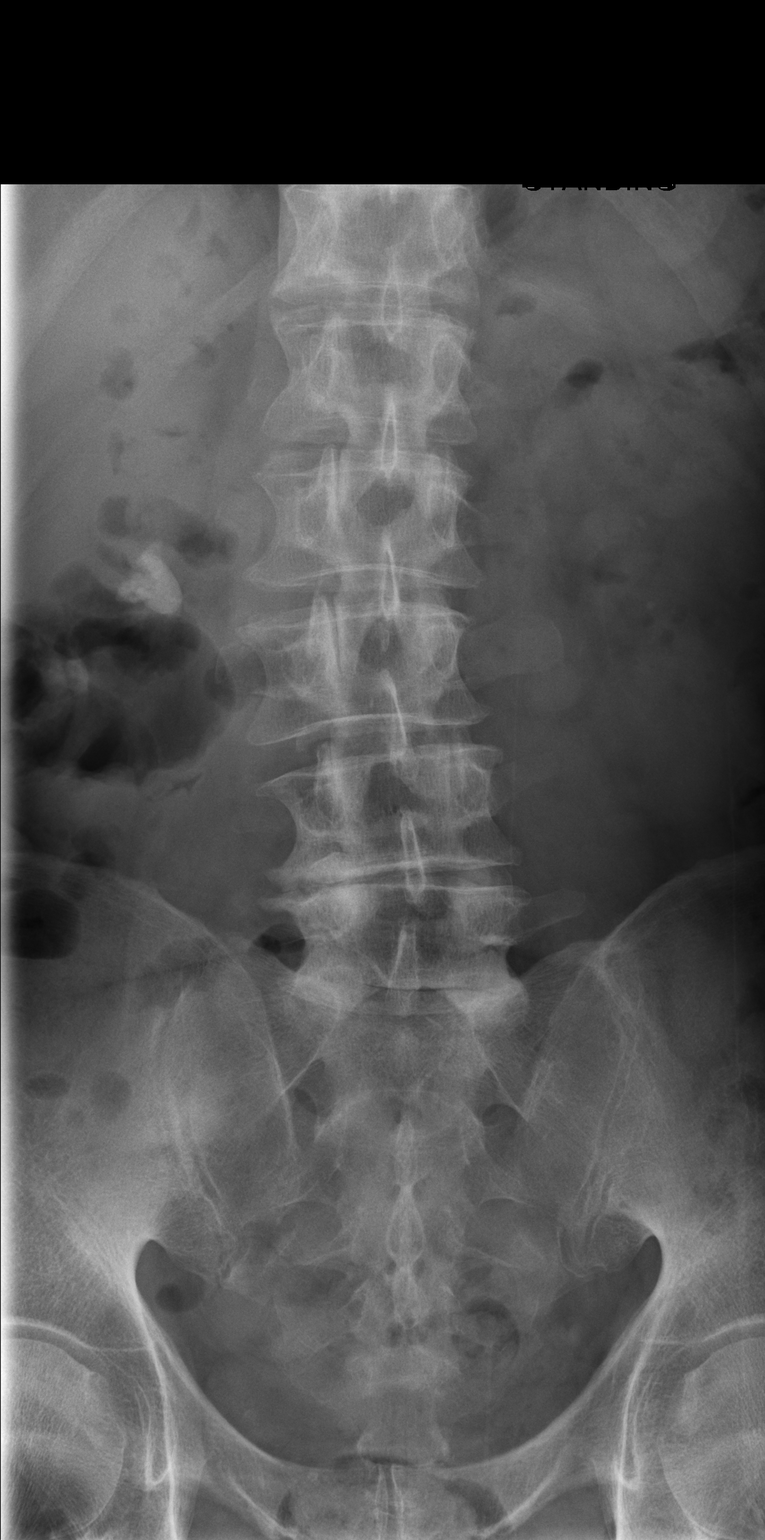

[w lumbar spine lat]
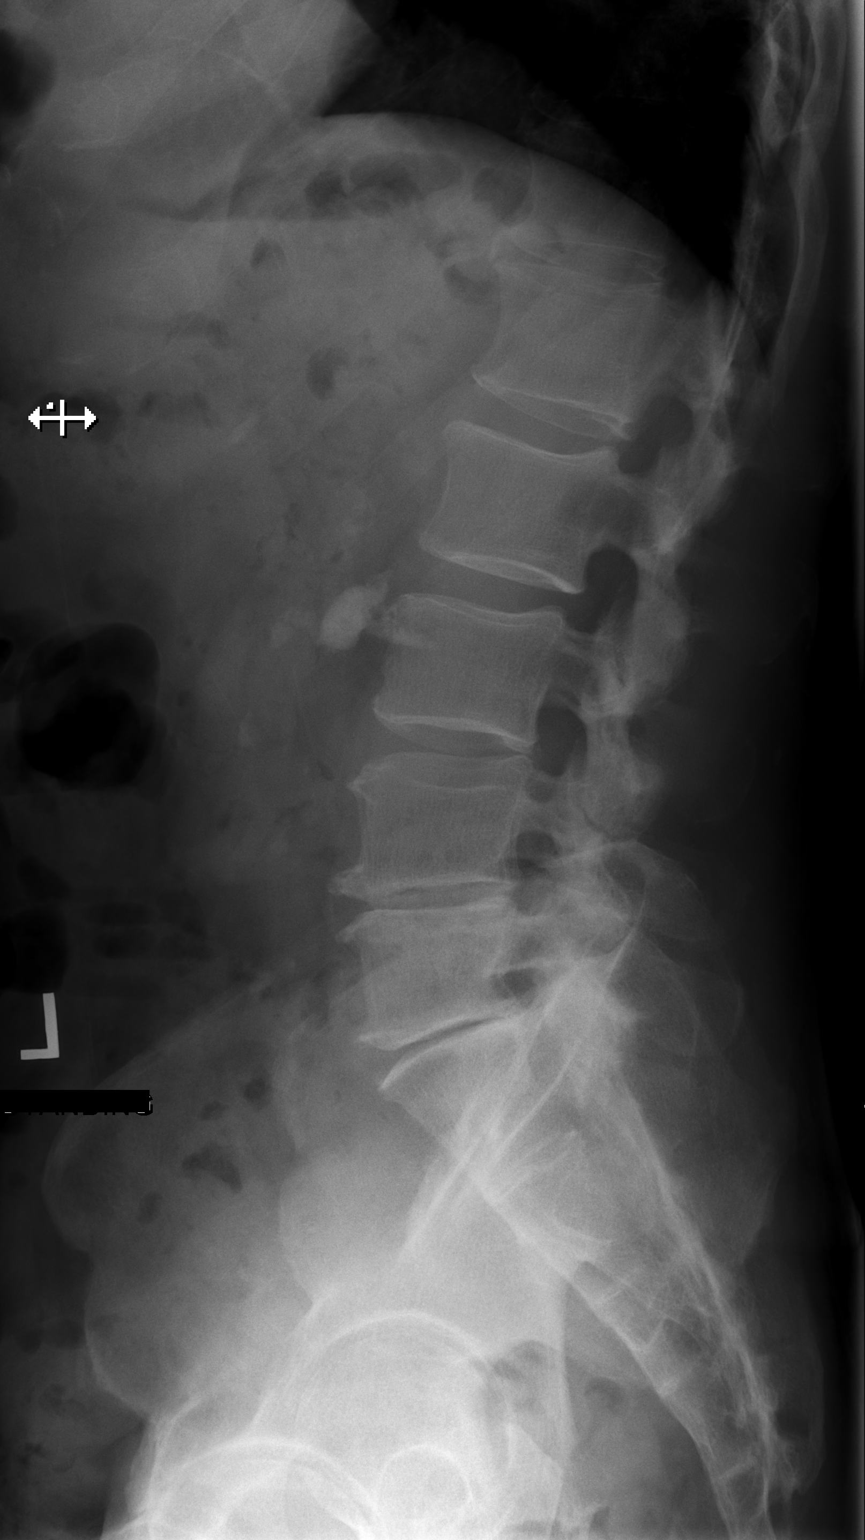

[w lumbar l-5 s-1 spot]
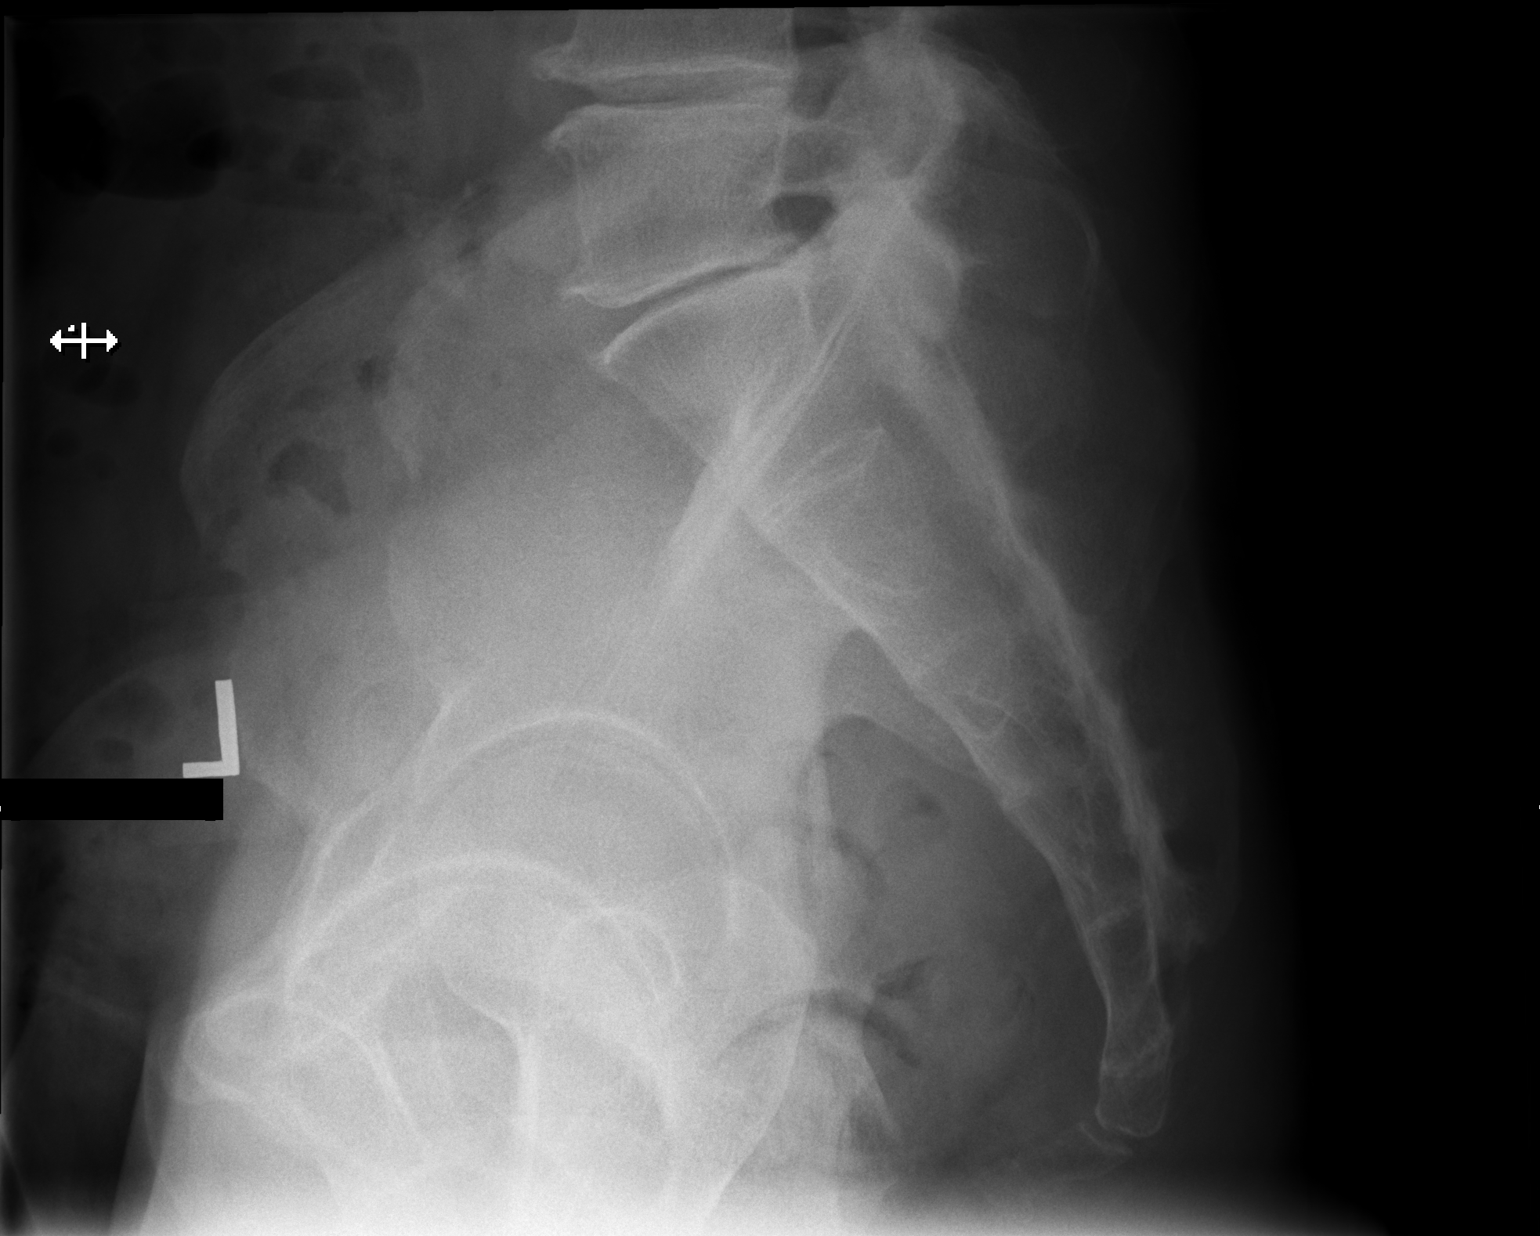

[3 of 3 positions shown; findings below may reference images not displayed]

FINDINGS: Five lumbar type vertebral bodies. Mild lumbar curvature convex to
the right with the apex at L3. Disc space narrowing at L4-5 and
L5-S1 and to a lesser extent at L3-4. Findings could certainly be
symptomatic. Partial staghorn calculus of the right kidney is noted.
Other smaller bilateral renal calculi.
IMPRESSION: Lower lumbar degenerative disease of an extent that could be
symptomatic.

Bilateral renal calculi, more extensive on the right than the left.

## 2021-01-23 ENCOUNTER — Other Ambulatory Visit (HOSPITAL_COMMUNITY): Payer: Self-pay

## 2021-02-03 ENCOUNTER — Other Ambulatory Visit: Payer: Self-pay | Admitting: Internal Medicine

## 2021-02-03 DIAGNOSIS — M25552 Pain in left hip: Secondary | ICD-10-CM

## 2021-02-03 DIAGNOSIS — M159 Polyosteoarthritis, unspecified: Secondary | ICD-10-CM

## 2021-02-05 ENCOUNTER — Other Ambulatory Visit (HOSPITAL_COMMUNITY): Payer: Self-pay

## 2021-02-05 ENCOUNTER — Other Ambulatory Visit: Payer: Self-pay | Admitting: Internal Medicine

## 2021-02-05 DIAGNOSIS — M25552 Pain in left hip: Secondary | ICD-10-CM

## 2021-02-05 DIAGNOSIS — M159 Polyosteoarthritis, unspecified: Secondary | ICD-10-CM

## 2021-02-05 MED ORDER — MELOXICAM 15 MG PO TABS
15.0000 mg | ORAL_TABLET | Freq: Every day | ORAL | 0 refills | Status: AC | PRN
  Filled 2021-02-05: qty 90, 90d supply, fill #0

## 2021-02-05 NOTE — Telephone Encounter (Signed)
Okay to refill. We probably need him to schedule follow up with [place] before any additional refills since 1 yr since we saw him, or ask his PCP about continued refills going forwards. He had mild decrease in eGFR on blood test in October which will need to be followed up before continuing meloxicam indefinitely.

## 2021-02-05 NOTE — Telephone Encounter (Signed)
Next Visit: follow-up PRN Last Visit: 01/15/2020   Last Fill: 10/21/2020   DX: Osteoarthrosis, generalized, multiple joints   Current Dose per office note 01/15/2020: meloxicam (MOBIC) 15 MG tablet PRN    Okay to refill Meloxicam?

## 2021-02-05 NOTE — Telephone Encounter (Signed)
Attempted to contact the patient and left message for patient to call the office.  

## 2021-02-12 ENCOUNTER — Other Ambulatory Visit (HOSPITAL_COMMUNITY): Payer: Self-pay

## 2021-03-21 ENCOUNTER — Other Ambulatory Visit (HOSPITAL_COMMUNITY): Payer: Self-pay

## 2021-03-24 ENCOUNTER — Other Ambulatory Visit (HOSPITAL_COMMUNITY): Payer: Self-pay

## 2021-04-15 ENCOUNTER — Other Ambulatory Visit (HOSPITAL_COMMUNITY): Payer: Self-pay

## 2021-05-14 ENCOUNTER — Other Ambulatory Visit: Payer: Self-pay | Admitting: Family Medicine

## 2021-05-15 ENCOUNTER — Other Ambulatory Visit (HOSPITAL_COMMUNITY): Payer: Self-pay

## 2021-05-15 MED ORDER — TAMSULOSIN HCL 0.4 MG PO CAPS
0.4000 mg | ORAL_CAPSULE | Freq: Every day | ORAL | 3 refills | Status: AC
  Filled 2021-05-15: qty 90, 90d supply, fill #0
  Filled 2021-10-06: qty 90, 90d supply, fill #1

## 2021-05-26 IMAGING — MR MR LUMBAR SPINE W/O CM
4 of 5 series · 30 of 48 positions shown · non-contrast
Comparison: Lumbar spine radiographs 01/21/2019

CLINICAL DATA: Left lower extremity radiculopathy for 4 months.

EXAM:
MRI LUMBAR SPINE WITHOUT CONTRAST
TECHNIQUE: Multiplanar, multisequence MR imaging of the lumbar spine was
performed. No intravenous contrast was administered.

[Series 5: T1 · sagittal · 4.0mm · 0.75mm/px · 6 of 17 slices shown (1 of 2)]
[im 1/17]
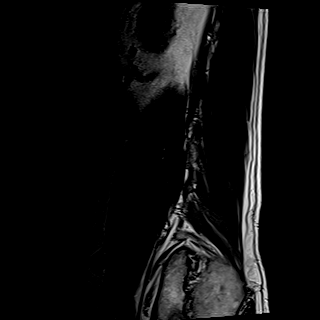
[im 4/17]
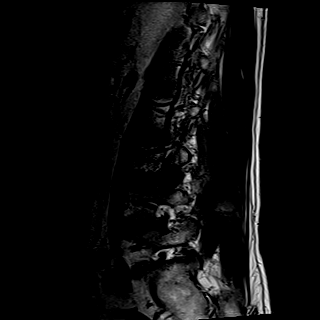
[im 7/17]
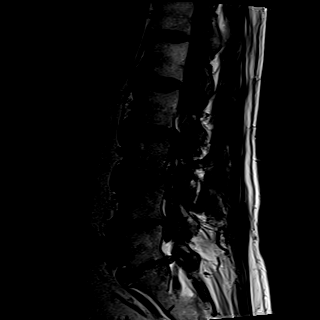
[im 10/17]
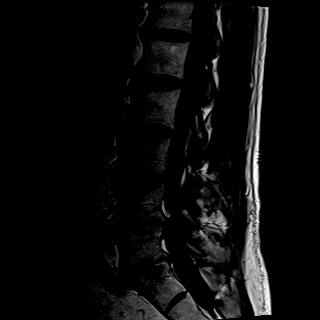
[im 13/17]
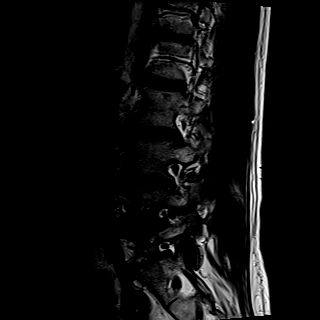
[im 17/17]
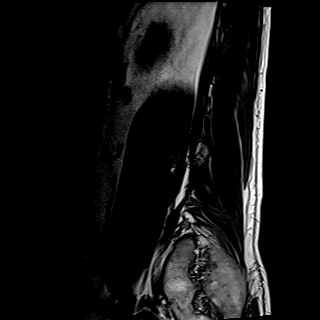

[Series 6: T2 · sagittal · 4.0mm · 0.75mm/px · 6 of 17 slices shown (1 of 2)]
[im 1/17]
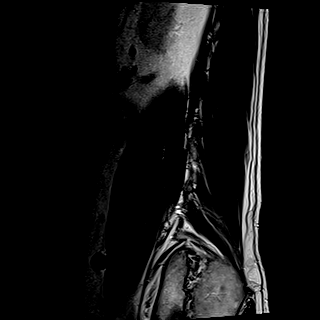
[im 4/17]
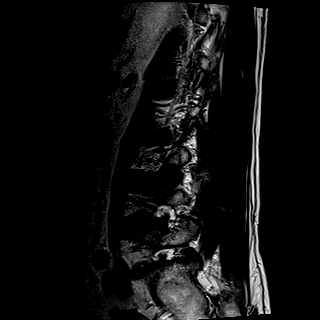
[im 7/17]
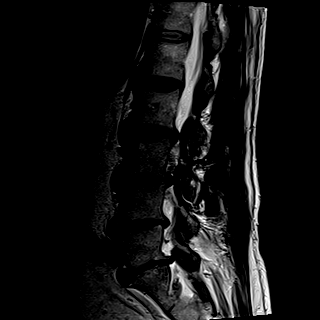
[im 10/17]
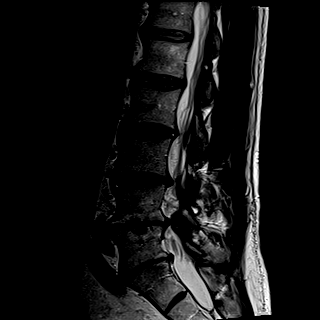
[im 13/17]
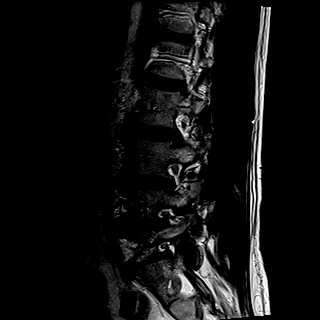
[im 17/17]
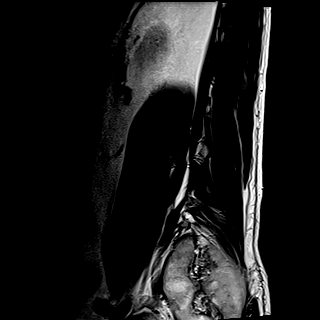

[Series 8: T2 · axial · 4.0mm · 0.62mm/px · z∈[-67,+143]mm · 9 of 41 slices shown (2 of 2)]
[im 1/41]
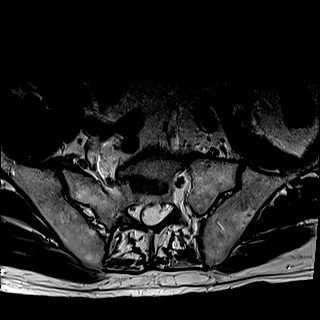
[im 6/41]
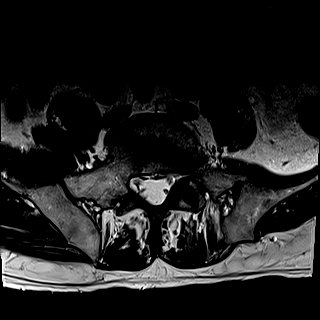
[im 12/41]
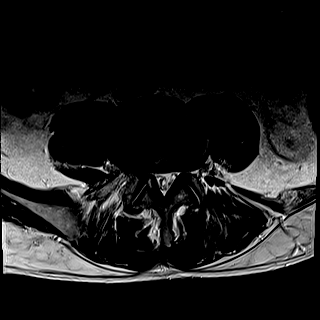
[im 18/41]
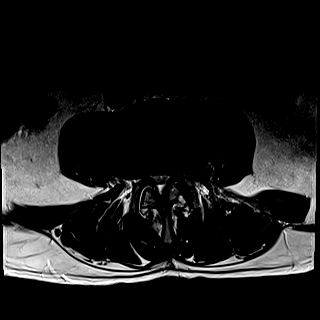
[im 21/41]
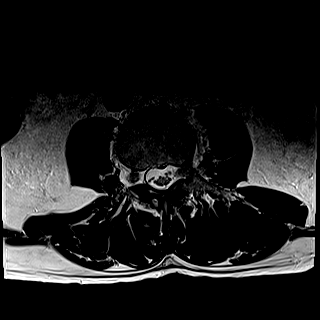
[im 23/41]
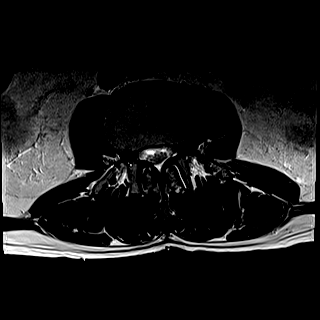
[im 29/41]
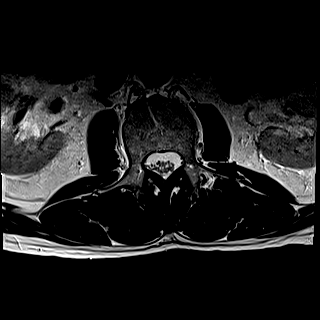
[im 35/41]
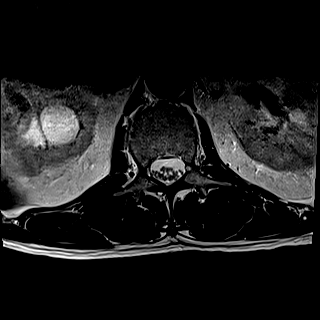
[im 41/41]
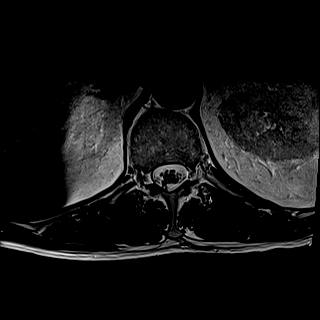

[Series 9: T1 · axial · 4.0mm · 0.43mm/px · z∈[-67,+144]mm · 9 of 41 slices shown (2 of 2)]
[im 1/41]
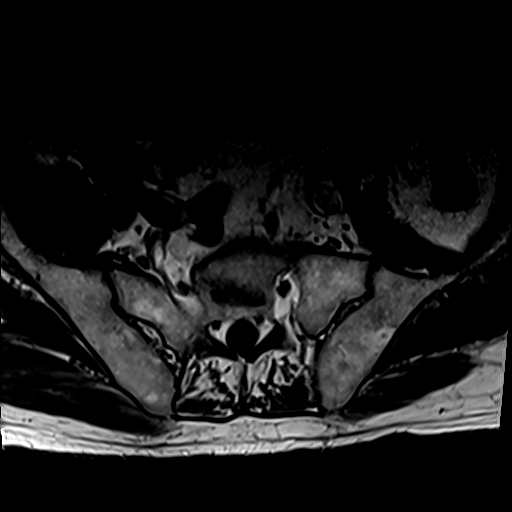
[im 6/41]
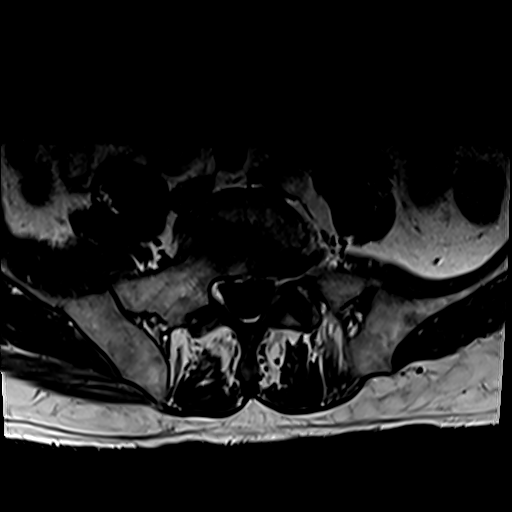
[im 12/41]
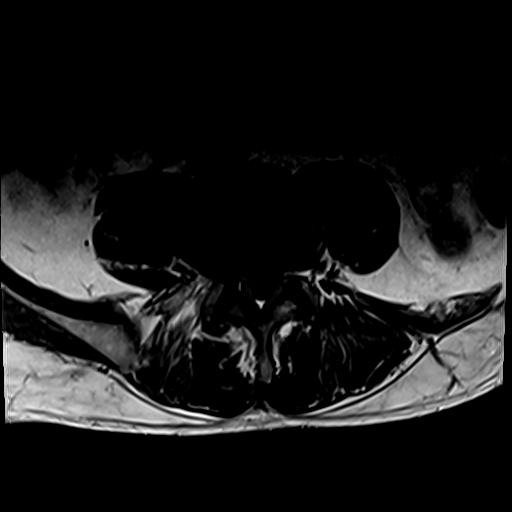
[im 18/41]
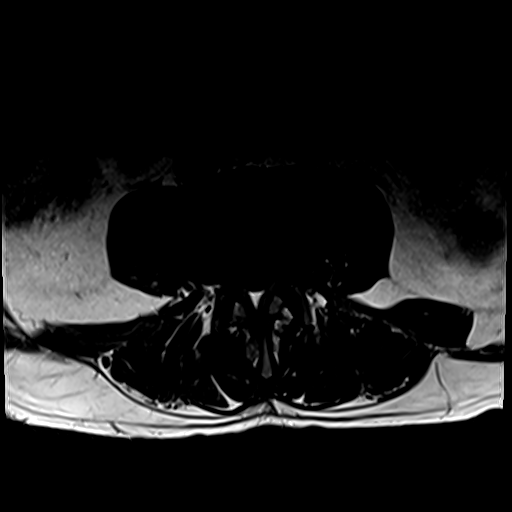
[im 21/41]
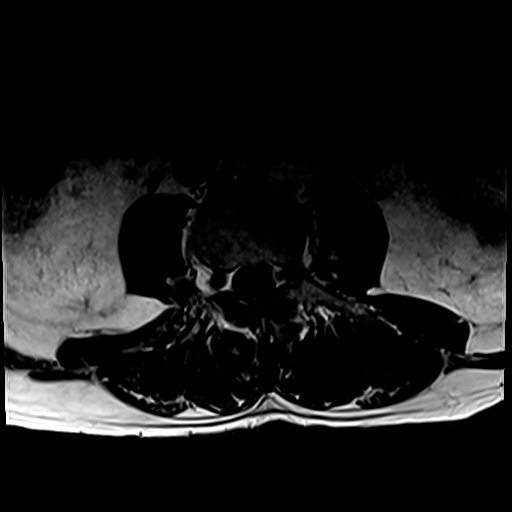
[im 23/41]
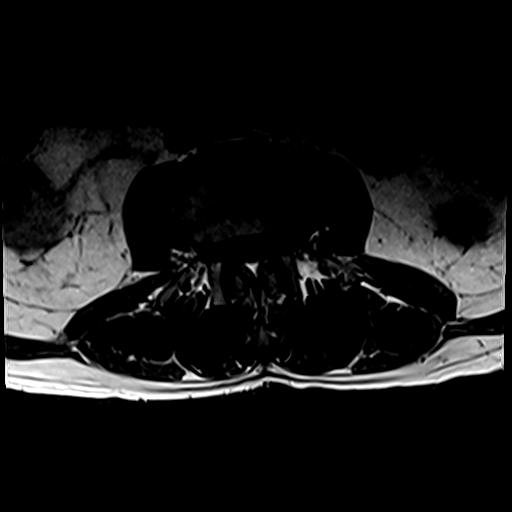
[im 29/41]
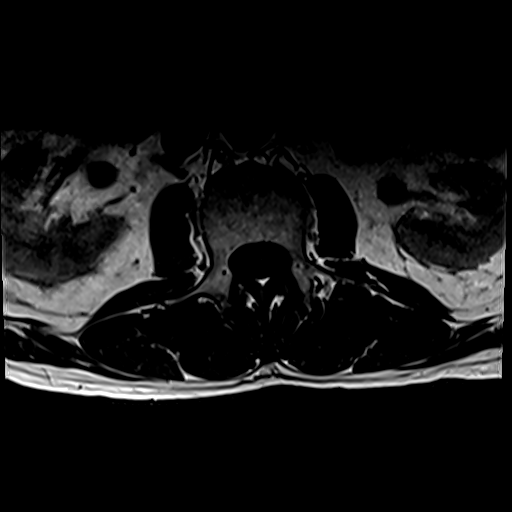
[im 35/41]
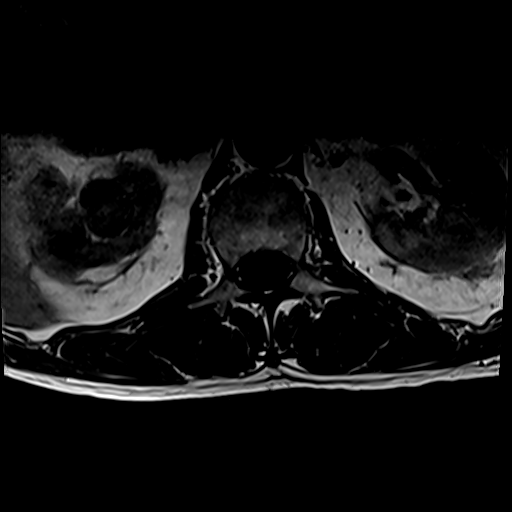
[im 41/41]
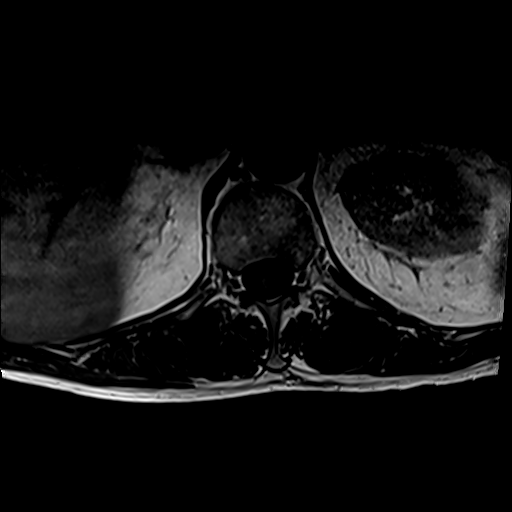

[30 of 48 positions shown; findings below may reference images not displayed]

FINDINGS: Segmentation: There are five lumbar type vertebral bodies. The last
full intervertebral disc space is labeled L5-S1. This correlates
with the lumbar radiographs.

Alignment: Mild scoliosis but normal alignment in the sagittal
plane.

Vertebrae: No bone lesions or fractures. Endplate reactive changes
noted at L4-5 and L5-S1.

Conus medullaris and cauda equina: Conus extends to the T12-L1
level. Conus and cauda equina appear normal.

Paraspinal and other soft tissues: No significant paraspinal or
retroperitoneal findings. Suspect right renal calculus in the renal
pelvis along with a parapelvic right renal cyst.

Disc levels:

L1-2: No significant findings.

L2-3: Diffuse bulging annulus, short pedicles and facet disease
contributing to mild spinal and bilateral lateral recess stenosis.
No significant foraminal stenosis.

L3-4: Diffuse bulging annulus, central disc protrusion, short
pedicles and facet disease contributing to moderately severe to
severe spinal and bilateral lateral recess stenosis. There is also a
large left foraminal disc extrusion measuring up to 15 mm and
extruding posterolaterally and definitely affecting the left L3
nerve root. This likely explains the patient's left radicular
symptoms.

L4-5: Advanced degenerative disc disease with a bulging degenerated
annulus. There is also moderate facet disease and the combination
creates mild spinal and moderate bilateral lateral recess stenosis.
There is also moderate right and mild left foraminal stenosis.

L5-S1: Advanced degenerative disc disease and facet disease but no
focal disc protrusion, significant spinal or foraminal stenosis.
IMPRESSION: 1. Mild multifactorial spinal and bilateral lateral recess stenosis
at L2-3.
2. Moderately severe to severe multifactorial spinal and bilateral
lateral recess stenosis at L3-4. There is also a large left
foraminal disc extrusion affecting the left L3 nerve root and likely
responsible for the patient's radicular symptoms.
3. Mild spinal and moderate bilateral lateral recess stenosis and
moderate right and mild left foraminal stenosis at L4-5.

## 2021-05-29 ENCOUNTER — Other Ambulatory Visit (HOSPITAL_COMMUNITY): Payer: Self-pay

## 2021-06-04 ENCOUNTER — Other Ambulatory Visit (HOSPITAL_COMMUNITY): Payer: Self-pay

## 2021-06-04 DIAGNOSIS — E7800 Pure hypercholesterolemia, unspecified: Secondary | ICD-10-CM | POA: Diagnosis not present

## 2021-06-04 DIAGNOSIS — M150 Primary generalized (osteo)arthritis: Secondary | ICD-10-CM | POA: Diagnosis not present

## 2021-06-04 MED ORDER — MAGNESIUM OXIDE -MG SUPPLEMENT 500 MG PO TABS
1000.0000 mg | ORAL_TABLET | Freq: Every day | ORAL | 1 refills | Status: AC
  Filled 2021-06-04: qty 180, 90d supply, fill #0

## 2021-06-09 ENCOUNTER — Other Ambulatory Visit (HOSPITAL_COMMUNITY): Payer: Self-pay

## 2021-06-16 DIAGNOSIS — Z136 Encounter for screening for cardiovascular disorders: Secondary | ICD-10-CM | POA: Diagnosis not present

## 2021-06-16 DIAGNOSIS — R339 Retention of urine, unspecified: Secondary | ICD-10-CM | POA: Diagnosis not present

## 2021-06-17 ENCOUNTER — Other Ambulatory Visit: Payer: Self-pay | Admitting: Internal Medicine

## 2021-06-17 DIAGNOSIS — M25552 Pain in left hip: Secondary | ICD-10-CM

## 2021-06-17 DIAGNOSIS — M159 Polyosteoarthritis, unspecified: Secondary | ICD-10-CM

## 2021-06-18 ENCOUNTER — Other Ambulatory Visit (HOSPITAL_COMMUNITY): Payer: Self-pay

## 2021-06-26 ENCOUNTER — Other Ambulatory Visit (HOSPITAL_COMMUNITY): Payer: Self-pay

## 2021-06-26 ENCOUNTER — Telehealth (HOSPITAL_COMMUNITY): Payer: Self-pay

## 2021-06-26 DIAGNOSIS — G4752 REM sleep behavior disorder: Secondary | ICD-10-CM | POA: Diagnosis not present

## 2021-06-26 DIAGNOSIS — G4710 Hypersomnia, unspecified: Secondary | ICD-10-CM | POA: Diagnosis not present

## 2021-06-26 DIAGNOSIS — I10 Essential (primary) hypertension: Secondary | ICD-10-CM | POA: Diagnosis not present

## 2021-06-26 DIAGNOSIS — R1310 Dysphagia, unspecified: Secondary | ICD-10-CM

## 2021-06-26 NOTE — Telephone Encounter (Signed)
Attempted to contact patient to schedule OP MBS - left voicemail. ?

## 2021-07-01 ENCOUNTER — Encounter: Payer: Self-pay | Admitting: *Deleted

## 2021-07-02 ENCOUNTER — Ambulatory Visit (HOSPITAL_COMMUNITY)
Admission: RE | Admit: 2021-07-02 | Discharge: 2021-07-02 | Disposition: A | Discharge status: Home / Self Care | Payer: 59 | Source: Ambulatory Visit | Attending: Pain Medicine | Admitting: Pain Medicine

## 2021-07-02 ENCOUNTER — Other Ambulatory Visit (HOSPITAL_COMMUNITY): Payer: Self-pay | Admitting: Pain Medicine

## 2021-07-02 ENCOUNTER — Encounter (HOSPITAL_COMMUNITY): Payer: Self-pay

## 2021-07-02 DIAGNOSIS — R1310 Dysphagia, unspecified: Secondary | ICD-10-CM

## 2021-07-02 NOTE — Progress Notes (Signed)
Modified Barium Swallow Progress Note  Patient Details  Name: Nicholas Houston MRN: [id number] Date of Birth: [birth year]  Today's Date: 07/02/2021  Modified Barium Swallow completed.  Full report located under Chart Review in the Imaging Section.  Brief recommendations include the following:  Clinical Impression  Mr. Brackins demonstrated a functional swallow during MBS with probable esophageal based dysphagia. He appears to have a mildly kyphotic cervical spine that could pose difficulty swallowing in the future if medical status decreases. Mechanics of swallow were adequate to prevent aspiration and normal flash penetration that spontaneously was ejected during the swallow. He had good strength and timing of swallow and protective mechanisms. There was minimal vallecular and pyriform sinus residue which was cleared with cue to swallow a second time. MBS does not diagnose below the level of UES however esophageal scan revealed possible esophgeal motility versus other involvement. The pill stopped mid esophagus and was not transited with thin barium but required applesauce to propel and moderate stasis remained with questionable retrograde movement.  Therapist educated pt on esophageal strategies via verbal and written means. Recommend continue regular diet using caution with meats and breads, thin liquids pills with water (unless needs puree) followed by bite of puree. He may benefit from GI consult given afore mentioned and reporting episodes of significant and uncomfortable. globus sensation for 30 minutes.   Swallow Evaluation Recommendations   Recommended Consults: Consider GI evaluation;Consider esophageal assessment   SLP Diet Recommendations: Regular solids;Thin liquid   Liquid Administration via: Cup;Straw   Medication Administration: Whole meds with liquid (or with puree if needed)   Supervision: Patient able to self feed   Compensations: Other (Comment) (dry swallow  intermittently)   Postural Changes: Seated upright at 90 degrees;Remain semi-upright after after feeds/meals (Comment)   Oral Care Recommendations: Oral care BID        Royce Macadamia 07/02/2021,1:39 PM

## 2021-07-11 ENCOUNTER — Other Ambulatory Visit (HOSPITAL_COMMUNITY): Payer: Self-pay

## 2021-07-11 DIAGNOSIS — E7800 Pure hypercholesterolemia, unspecified: Secondary | ICD-10-CM | POA: Diagnosis not present

## 2021-07-11 DIAGNOSIS — M5127 Other intervertebral disc displacement, lumbosacral region: Secondary | ICD-10-CM | POA: Diagnosis not present

## 2021-07-11 DIAGNOSIS — I10 Essential (primary) hypertension: Secondary | ICD-10-CM | POA: Diagnosis not present

## 2021-07-11 DIAGNOSIS — Z6823 Body mass index (BMI) 23.0-23.9, adult: Secondary | ICD-10-CM | POA: Diagnosis not present

## 2021-07-11 MED ORDER — LOSARTAN POTASSIUM-HCTZ 100-12.5 MG PO TABS
1.0000 | ORAL_TABLET | Freq: Every day | ORAL | 1 refills | Status: AC
  Filled 2021-07-11: qty 90, 90d supply, fill #0

## 2021-07-11 MED ORDER — CELECOXIB 200 MG PO CAPS
200.0000 mg | ORAL_CAPSULE | Freq: Every day | ORAL | 1 refills | Status: DC
  Filled 2021-07-11: qty 90, 90d supply, fill #0
  Filled 2021-10-13: qty 90, 90d supply, fill #1

## 2021-07-11 MED ORDER — ROSUVASTATIN CALCIUM 10 MG PO TABS
10.0000 mg | ORAL_TABLET | Freq: Every day | ORAL | 1 refills | Status: AC
  Filled 2021-07-11: qty 90, 90d supply, fill #0

## 2021-07-11 MED ORDER — TIZANIDINE HCL 4 MG PO TABS
4.0000 mg | ORAL_TABLET | Freq: Four times a day (QID) | ORAL | 1 refills | Status: DC | PRN
  Filled 2021-07-11: qty 270, 90d supply, fill #0

## 2021-07-11 MED ORDER — AMLODIPINE BESYLATE 10 MG PO TABS
10.0000 mg | ORAL_TABLET | Freq: Every evening | ORAL | 0 refills | Status: AC
  Filled 2021-07-11: qty 90, 90d supply, fill #0

## 2021-08-13 ENCOUNTER — Other Ambulatory Visit (HOSPITAL_COMMUNITY): Payer: Self-pay

## 2021-08-14 ENCOUNTER — Other Ambulatory Visit (HOSPITAL_COMMUNITY): Payer: Self-pay

## 2021-08-14 ENCOUNTER — Other Ambulatory Visit: Payer: Self-pay | Admitting: Family Medicine

## 2021-08-14 MED ORDER — CYCLOBENZAPRINE HCL 10 MG PO TABS
10.0000 mg | ORAL_TABLET | Freq: Every evening | ORAL | 0 refills | Status: AC | PRN
  Filled 2021-08-14: qty 20, 20d supply, fill #0

## 2021-08-14 MED ORDER — GABAPENTIN 100 MG PO CAPS
100.0000 mg | ORAL_CAPSULE | Freq: Two times a day (BID) | ORAL | 1 refills | Status: AC | PRN
  Filled 2021-08-14: qty 60, 30d supply, fill #0
  Filled 2021-09-30: qty 60, 30d supply, fill #1

## 2021-08-21 DIAGNOSIS — M2560 Stiffness of unspecified joint, not elsewhere classified: Secondary | ICD-10-CM | POA: Diagnosis not present

## 2021-08-21 DIAGNOSIS — M545 Low back pain, unspecified: Secondary | ICD-10-CM | POA: Diagnosis not present

## 2021-08-22 ENCOUNTER — Other Ambulatory Visit (HOSPITAL_COMMUNITY): Payer: Self-pay

## 2021-08-29 DIAGNOSIS — M545 Low back pain, unspecified: Secondary | ICD-10-CM | POA: Diagnosis not present

## 2021-08-29 DIAGNOSIS — M2560 Stiffness of unspecified joint, not elsewhere classified: Secondary | ICD-10-CM | POA: Diagnosis not present

## 2021-09-04 DIAGNOSIS — M2560 Stiffness of unspecified joint, not elsewhere classified: Secondary | ICD-10-CM | POA: Diagnosis not present

## 2021-09-04 DIAGNOSIS — M542 Cervicalgia: Secondary | ICD-10-CM | POA: Diagnosis not present

## 2021-09-04 DIAGNOSIS — M545 Low back pain, unspecified: Secondary | ICD-10-CM | POA: Diagnosis not present

## 2021-09-12 DIAGNOSIS — M2560 Stiffness of unspecified joint, not elsewhere classified: Secondary | ICD-10-CM | POA: Diagnosis not present

## 2021-09-12 DIAGNOSIS — M545 Low back pain, unspecified: Secondary | ICD-10-CM | POA: Diagnosis not present

## 2021-09-12 DIAGNOSIS — M542 Cervicalgia: Secondary | ICD-10-CM | POA: Diagnosis not present

## 2021-09-17 DIAGNOSIS — M2560 Stiffness of unspecified joint, not elsewhere classified: Secondary | ICD-10-CM | POA: Diagnosis not present

## 2021-09-17 DIAGNOSIS — M545 Low back pain, unspecified: Secondary | ICD-10-CM | POA: Diagnosis not present

## 2021-09-17 DIAGNOSIS — M542 Cervicalgia: Secondary | ICD-10-CM | POA: Diagnosis not present

## 2021-09-25 DIAGNOSIS — M542 Cervicalgia: Secondary | ICD-10-CM | POA: Diagnosis not present

## 2021-09-25 DIAGNOSIS — M545 Low back pain, unspecified: Secondary | ICD-10-CM | POA: Diagnosis not present

## 2021-09-25 DIAGNOSIS — M2560 Stiffness of unspecified joint, not elsewhere classified: Secondary | ICD-10-CM | POA: Diagnosis not present

## 2021-09-30 ENCOUNTER — Other Ambulatory Visit (HOSPITAL_COMMUNITY): Payer: Self-pay

## 2021-10-02 DIAGNOSIS — M542 Cervicalgia: Secondary | ICD-10-CM | POA: Diagnosis not present

## 2021-10-02 DIAGNOSIS — M545 Low back pain, unspecified: Secondary | ICD-10-CM | POA: Diagnosis not present

## 2021-10-02 DIAGNOSIS — M2560 Stiffness of unspecified joint, not elsewhere classified: Secondary | ICD-10-CM | POA: Diagnosis not present

## 2021-10-06 ENCOUNTER — Other Ambulatory Visit (HOSPITAL_COMMUNITY): Payer: Self-pay

## 2021-10-09 DIAGNOSIS — M545 Low back pain, unspecified: Secondary | ICD-10-CM | POA: Diagnosis not present

## 2021-10-09 DIAGNOSIS — M542 Cervicalgia: Secondary | ICD-10-CM | POA: Diagnosis not present

## 2021-10-09 DIAGNOSIS — M2560 Stiffness of unspecified joint, not elsewhere classified: Secondary | ICD-10-CM | POA: Diagnosis not present

## 2021-10-13 ENCOUNTER — Other Ambulatory Visit (HOSPITAL_COMMUNITY): Payer: Self-pay

## 2021-10-13 MED ORDER — TIZANIDINE HCL 4 MG PO TABS
4.0000 mg | ORAL_TABLET | Freq: Four times a day (QID) | ORAL | 1 refills | Status: AC | PRN
  Filled 2021-10-13: qty 270, 90d supply, fill #0

## 2021-10-16 DIAGNOSIS — M542 Cervicalgia: Secondary | ICD-10-CM | POA: Diagnosis not present

## 2021-10-16 DIAGNOSIS — M2560 Stiffness of unspecified joint, not elsewhere classified: Secondary | ICD-10-CM | POA: Diagnosis not present

## 2021-10-16 DIAGNOSIS — M545 Low back pain, unspecified: Secondary | ICD-10-CM | POA: Diagnosis not present

## 2021-10-23 DIAGNOSIS — M545 Low back pain, unspecified: Secondary | ICD-10-CM | POA: Diagnosis not present

## 2021-10-23 DIAGNOSIS — M2560 Stiffness of unspecified joint, not elsewhere classified: Secondary | ICD-10-CM | POA: Diagnosis not present

## 2021-10-23 DIAGNOSIS — M542 Cervicalgia: Secondary | ICD-10-CM | POA: Diagnosis not present

## 2021-10-30 DIAGNOSIS — M542 Cervicalgia: Secondary | ICD-10-CM | POA: Diagnosis not present

## 2021-10-30 DIAGNOSIS — M2560 Stiffness of unspecified joint, not elsewhere classified: Secondary | ICD-10-CM | POA: Diagnosis not present

## 2021-10-30 DIAGNOSIS — M545 Low back pain, unspecified: Secondary | ICD-10-CM | POA: Diagnosis not present

## 2021-11-06 DIAGNOSIS — M542 Cervicalgia: Secondary | ICD-10-CM | POA: Diagnosis not present

## 2021-11-06 DIAGNOSIS — M2560 Stiffness of unspecified joint, not elsewhere classified: Secondary | ICD-10-CM | POA: Diagnosis not present

## 2021-11-06 DIAGNOSIS — M545 Low back pain, unspecified: Secondary | ICD-10-CM | POA: Diagnosis not present

## 2021-11-13 DIAGNOSIS — M2560 Stiffness of unspecified joint, not elsewhere classified: Secondary | ICD-10-CM | POA: Diagnosis not present

## 2021-11-13 DIAGNOSIS — M542 Cervicalgia: Secondary | ICD-10-CM | POA: Diagnosis not present

## 2021-11-13 DIAGNOSIS — M545 Low back pain, unspecified: Secondary | ICD-10-CM | POA: Diagnosis not present

## 2021-11-20 DIAGNOSIS — M542 Cervicalgia: Secondary | ICD-10-CM | POA: Diagnosis not present

## 2021-11-20 DIAGNOSIS — M545 Low back pain, unspecified: Secondary | ICD-10-CM | POA: Diagnosis not present

## 2021-11-20 DIAGNOSIS — M2560 Stiffness of unspecified joint, not elsewhere classified: Secondary | ICD-10-CM | POA: Diagnosis not present

## 2021-11-27 DIAGNOSIS — M545 Low back pain, unspecified: Secondary | ICD-10-CM | POA: Diagnosis not present

## 2021-11-27 DIAGNOSIS — M542 Cervicalgia: Secondary | ICD-10-CM | POA: Diagnosis not present

## 2021-11-27 DIAGNOSIS — M2560 Stiffness of unspecified joint, not elsewhere classified: Secondary | ICD-10-CM | POA: Diagnosis not present

## 2021-12-05 DIAGNOSIS — M2560 Stiffness of unspecified joint, not elsewhere classified: Secondary | ICD-10-CM | POA: Diagnosis not present

## 2021-12-05 DIAGNOSIS — M545 Low back pain, unspecified: Secondary | ICD-10-CM | POA: Diagnosis not present

## 2021-12-05 DIAGNOSIS — M542 Cervicalgia: Secondary | ICD-10-CM | POA: Diagnosis not present

## 2022-01-13 ENCOUNTER — Other Ambulatory Visit (HOSPITAL_COMMUNITY): Payer: Self-pay

## 2022-01-13 MED ORDER — CELECOXIB 200 MG PO CAPS
200.0000 mg | ORAL_CAPSULE | Freq: Every day | ORAL | 1 refills | Status: AC
Start: 2022-01-13 — End: ?
  Filled 2022-01-13: qty 90, 90d supply, fill #0

## 2022-02-05 ENCOUNTER — Other Ambulatory Visit (HOSPITAL_COMMUNITY): Payer: Self-pay

## 2022-02-06 ENCOUNTER — Other Ambulatory Visit (HOSPITAL_COMMUNITY): Payer: Self-pay

## 2022-10-05 ENCOUNTER — Other Ambulatory Visit (HOSPITAL_COMMUNITY): Payer: Self-pay

## 2022-10-05 MED ORDER — TAMSULOSIN HCL 0.4 MG PO CAPS
0.4000 mg | ORAL_CAPSULE | Freq: Every day | ORAL | 3 refills | Status: AC
  Filled 2022-10-05: qty 90, 90d supply, fill #0

## 2022-10-15 ENCOUNTER — Other Ambulatory Visit (HOSPITAL_COMMUNITY): Payer: Self-pay

## 2022-11-23 ENCOUNTER — Telehealth: Payer: Self-pay

## 2022-11-23 NOTE — Transitions of Care (Post Inpatient/ED Visit) (Unsigned)
   11/23/2022  Name: Nicholas Houston MRN: [id number] DOB: 1950/02/07  Today's TOC FU Call Status: Today's TOC FU Call Status:: Unsuccessful Call (1st Attempt) Unsuccessful Call (1st Attempt) Date: 11/23/22  Attempted to reach the patient regarding the most recent Inpatient/ED visit.  Follow Up Plan: Additional outreach attempts will be made to reach the patient to complete the Transitions of Care (Post Inpatient/ED visit) call.   Signature Karena Addison, LPN [facility] Nurse Health Advisor Direct Dial [phone]

## 2022-11-25 NOTE — Transitions of Care (Post Inpatient/ED Visit) (Unsigned)
   11/25/2022  Name: Nicholas Houston MRN: [id number] DOB: 03-Apr-1950  Today's TOC FU Call Status: Today's TOC FU Call Status:: Unsuccessful Call (2nd Attempt) Unsuccessful Call (1st Attempt) Date: 11/23/22 Unsuccessful Call (2nd Attempt) Date: 11/25/22  Attempted to reach the patient regarding the most recent Inpatient/ED visit.  Follow Up Plan: Additional outreach attempts will be made to reach the patient to complete the Transitions of Care (Post Inpatient/ED visit) call.   Signature Karena Addison, LPN [facility] Nurse Health Advisor Direct Dial [phone]

## 2022-11-26 NOTE — Transitions of Care (Post Inpatient/ED Visit) (Signed)
   11/26/2022  Name: Nicholas Houston MRN: [id number] DOB: 1951-01-11  Today's TOC FU Call Status: Today's TOC FU Call Status:: Unsuccessful Call (3rd Attempt) Unsuccessful Call (1st Attempt) Date: 11/23/22 Unsuccessful Call (2nd Attempt) Date: 11/25/22 Unsuccessful Call (3rd Attempt) Date: 11/26/22  Attempted to reach the patient regarding the most recent Inpatient/ED visit.  Follow Up Plan: No further outreach attempts will be made at this time. We have been unable to contact the patient.  Signature Karena Addison, LPN [facility] Nurse Health Advisor Direct Dial [phone]
